# Patient Record
Sex: Male | Born: 1965 | Hispanic: Yes | Marital: Married | Smoking: Never smoker
Health system: Southern US, Community
[De-identification: ages and names within clinical notes are randomized; demographics above are authoritative.]

## PROBLEM LIST (undated history)

## (undated) DIAGNOSIS — Z531 Procedure and treatment not carried out because of patient's decision for reasons of belief and group pressure: Secondary | ICD-10-CM

## (undated) DIAGNOSIS — R42 Dizziness and giddiness: Secondary | ICD-10-CM

## (undated) DIAGNOSIS — IMO0001 Reserved for inherently not codable concepts without codable children: Secondary | ICD-10-CM

## (undated) DIAGNOSIS — D509 Iron deficiency anemia, unspecified: Secondary | ICD-10-CM

## (undated) DIAGNOSIS — R011 Cardiac murmur, unspecified: Secondary | ICD-10-CM

## (undated) DIAGNOSIS — K644 Residual hemorrhoidal skin tags: Secondary | ICD-10-CM

## (undated) DIAGNOSIS — K642 Third degree hemorrhoids: Secondary | ICD-10-CM

## (undated) HISTORY — PX: ADENOIDECTOMY: SUR15

## (undated) HISTORY — PX: COLONOSCOPY: SHX174

## (undated) HISTORY — DX: Iron deficiency anemia, unspecified: D50.9

## (undated) HISTORY — DX: Third degree hemorrhoids: K64.2

## (undated) HISTORY — PX: TONSILLECTOMY: SUR1361

## (undated) HISTORY — DX: Dizziness and giddiness: R42

## (undated) HISTORY — DX: Cardiac murmur, unspecified: R01.1

## (undated) HISTORY — DX: Residual hemorrhoidal skin tags: K64.4

## (undated) HISTORY — PX: OTHER SURGICAL HISTORY: SHX169

---

## 2014-05-31 ENCOUNTER — Encounter: Payer: Self-pay | Admitting: *Deleted

## 2014-06-12 ENCOUNTER — Encounter: Payer: Self-pay | Admitting: Cardiovascular Disease

## 2014-06-12 ENCOUNTER — Ambulatory Visit (INDEPENDENT_AMBULATORY_CARE_PROVIDER_SITE_OTHER): Payer: BC Managed Care – PPO | Admitting: Cardiovascular Disease

## 2014-06-12 ENCOUNTER — Encounter (INDEPENDENT_AMBULATORY_CARE_PROVIDER_SITE_OTHER): Payer: Self-pay

## 2014-06-12 VITALS — BP 132/90 | HR 61 | Ht 70.0 in | Wt 178.0 lb

## 2014-06-12 DIAGNOSIS — R011 Cardiac murmur, unspecified: Secondary | ICD-10-CM | POA: Insufficient documentation

## 2014-06-12 DIAGNOSIS — K644 Residual hemorrhoidal skin tags: Secondary | ICD-10-CM | POA: Insufficient documentation

## 2014-06-12 DIAGNOSIS — D509 Iron deficiency anemia, unspecified: Secondary | ICD-10-CM | POA: Insufficient documentation

## 2014-06-12 DIAGNOSIS — R42 Dizziness and giddiness: Secondary | ICD-10-CM | POA: Insufficient documentation

## 2014-06-12 MED ORDER — FERROUS FUMARATE 325 (106 FE) MG PO TABS: 1.0000 | ORAL_TABLET | Freq: Two times a day (BID) | ORAL | Status: DC

## 2014-06-12 NOTE — Patient Instructions (Addendum)
Please increase your iron pill to twice a day  Your physician has requested that you have an echocardiogram. Echocardiography is a painless test that uses sound waves to create images of your heart. It provides your doctor with information about the size and shape of your heart and how well your heart's chambers and valves are working. This procedure takes approximately one hour. There are no restrictions for this procedure.  Follow up as needed.

## 2014-06-12 NOTE — Progress Notes (Signed)
[address] [place], [place] [address]  Date:  06/12/2014   Patient ID:  Garrett KochJose Barajas, DOB 1966-08-24, MRN [id number]   PCP:  Nadea Kirkland NOT IN SYSTEM  Cardiologist:  Dr. Kirke CorinArida      Patient Profile: 48 y.o. male with history of gradual, intermittent rectal bleeding, external hemorrhoids, and heart murmur.  History of Present Illness: Garrett Barajas is a 48 y.o. male with the above history presents for cardiology referral by Dr. Sherryll BurgerShah, MD for dizziness and heart murmur.   Patient has been feeling dizzy and fatigued for the past month. He would get tired just walking out to the dumpster taking out his trash. He noted a dizziness sensation and states he felt off balance. He does not state the room was spinning. He does not describe a specific event that occurred just prior to the above. He denies any chest pain, chest tightness, chest pressure, SOB, palpitations, presyncope, syncope, edema, early satiety, weight gain, cough, tinnitus, or recent URI. He states many years ago back in [place] he was told he had a heart murmur but no formal work up was done. He denies any history of rheumatic fever.   He does have a history of hemorrhoids. They have present present for several years. PCP note indicates 4 years. Patient states he had a colonoscopy in 2011 for BRBPR that was self reportedly normal except for hemorrhoids.   Patient notes over the past 1 month he has had gradual on and off BRBPR. He had an appointment to see Dr. Marguerite OleaMoffett on 06/06/14, but he canceled it until he could be seen here. He does plan to reschedule this appointment.    Recent Labs: No results found for requested labs within last 365 days.  Wt Readings from Last 3 Encounters:  06/12/14 178 lb (80.74 kg)     Past Medical History  Diagnosis Date  . External hemorrhoids without mention of complication   . Undiagnosed cardiac murmurs   . Dizziness   . Iron deficiency anemia     a. Hgb 6.0 on 05/18/14-  started on ferrous fumarate, patient is a Jehovah's Witness    Current Outpatient Prescriptions  Medication Sig Dispense Refill  . ferrous fumarate (HEMOCYTE - 106 MG FE) 325 (106 FE) MG TABS tablet Take 1 tablet (106 mg of iron total) by mouth 2 (two) times daily.  60 each  4  . MULTIPLE VITAMINS PO Take by mouth daily.       No current facility-administered medications for this visit.    Allergies:   Review of patient's allergies indicates no known allergies.   Social History:  The patient  reports that he has never smoked. He does not have any smokeless tobacco history on file. He reports that he drinks alcohol. He reports that he does not use illicit drugs.   Family History:  The patient's family history includes Diabetes in his father, maternal grandmother, and mother; Hypertension in his father and mother.   ROS:  Please see the the history of present illness. All other systems reviewed and negative.   PHYSICAL EXAM:  VS:  BP 132/90  Pulse 61  Ht 5\' 10"  (1.778 m)  Wt 178 lb (80.74 kg)  BMI 25.54 kg/m2 Well nourished, well developed, in no acute distress HEENT: normal Neck: no JVD Cardiac:  normal S1, S2; RRR. 2/6 flow murmur Lungs:  clear to auscultation bilaterally, no wheezing, rhonchi or rales Abd: soft, nontender, no hepatomegaly Ext: no edema Skin: warm and  dry Neuro:  moves all extremities spontaneously, no focal abnormalities noted  EKG:  NSR, 61, no st/t wave changes  ASSESSMENT AND PLAN:  1. Anemia: Patient was found to have Hgb of 6, MCV 72, iron 11, iron sat 2, TIBC 515, transferrin 459 on 05/18/14. He has had a long history of on off gradual rectal bleeding 2/2 hemorrhoids. Had a colonoscopy in 2011, which is self reported as normal. 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 He was started on ferrous fumarate 325 mg daily 1 month ago and note improvement. He had a GI appointment on 06/06/14 but canceled this until he could be seen here.  He does plan to follow up with GI. We have increased his ferrous fumarate 325 mg to bid. Take with Miralax to prevent constipation in the setting of known hemorrhoids.   2. Dizziness: This is improved since starting ferous fumarate. Patient states he feels like a "new man." Orthostatics ok. CMP ok. See. #1. Recommend TSH if this has not been checked recently.   3. Heart murmur: This is improved from PCP note on 1/6/106/4/15, thus certainly could be a flow murmur related to his anemia. No history of rheumatic fever. Will evaluate with echo. Follow up prn.    I have seen and examined the patient. I agree with the above note which was modified by me as needed.   Lorine BearsMuhammad Arida MD, [facility] 06/12/2014 3:59 PM

## 2014-06-23 ENCOUNTER — Other Ambulatory Visit: Payer: Self-pay

## 2014-06-23 ENCOUNTER — Other Ambulatory Visit (INDEPENDENT_AMBULATORY_CARE_PROVIDER_SITE_OTHER): Payer: BC Managed Care – PPO

## 2014-06-23 DIAGNOSIS — R011 Cardiac murmur, unspecified: Secondary | ICD-10-CM

## 2014-06-23 DIAGNOSIS — R42 Dizziness and giddiness: Secondary | ICD-10-CM

## 2015-12-03 ENCOUNTER — Ambulatory Visit (INDEPENDENT_AMBULATORY_CARE_PROVIDER_SITE_OTHER): Payer: BLUE CROSS/BLUE SHIELD | Admitting: General Surgery

## 2015-12-03 ENCOUNTER — Encounter: Payer: Self-pay | Admitting: General Surgery

## 2015-12-03 VITALS — BP 147/95 | HR 65 | Temp 98.0°F | Ht 70.0 in | Wt 180.0 lb

## 2015-12-03 DIAGNOSIS — K642 Third degree hemorrhoids: Secondary | ICD-10-CM

## 2015-12-03 DIAGNOSIS — K648 Other hemorrhoids: Secondary | ICD-10-CM

## 2015-12-03 MED ORDER — HYDROCORTISONE ACETATE 25 MG RE SUPP: 25.0000 mg | Freq: Two times a day (BID) | RECTAL | Status: DC

## 2015-12-03 NOTE — Progress Notes (Signed)
Outpatient Surgical Follow Up  12/03/2015  Garrett Barajas is an 49 y.o. male.   Chief Complaint  Patient presents with  . Hemorrhoids    HPI: 49 year old male returns to clinic for further evaluation of bleeding internal hemorrhoids. Patient states that over the last 2 years that about once a month he has a popping sensation followed by bright red blood per rectum and hemorrhoidal tissue falling out of his rectum that he has to mainly reduced. Every time this happens he will bleed for approximately 2 weeks. The bleeding can make it into the toilet bowl and then gradually is just on the toilet paper until it stops. He is only been using Preparation H for this. He has not been taking any fiber supplementation and has never been prescribed any steroids for the inflammation. Patient is otherwise in his usual state of excellent health. He denies any fevers, chills, nausea, vomiting, diarrhea, chest pain, shortness of breath. He does state he has intermittent hard stools.  Past Medical History  Diagnosis Date  . External hemorrhoids without mention of complication   . Undiagnosed cardiac murmurs   . Dizziness   . Iron deficiency anemia     a. Hgb 6.0 on 05/18/14- started on ferrous fumarate, patient is a Jehovah's Witness    Past Surgical History  Procedure Laterality Date  . Colonoscopy    . Adenoidectomy    . Tonsillectomy    . Eye growth removal      Family History  Problem Relation Age of Onset  . Diabetes Mother   . Hypertension Mother   . Diabetes Father   . Hypertension Father   . Diabetes Maternal Grandmother     Social History:  reports that he has never smoked. He has never used smokeless tobacco. He reports that he drinks about 0.6 oz of alcohol per week. He reports that he does not use illicit drugs.  Allergies: No Known Allergies  Medications reviewed.    ROS Multisystem review of systems was completed. All pertinent positives negatives are within the history of  present illness.   BP 147/95 mmHg  Pulse 65  Temp(Src) 98 F (36.7 C) (Oral)  Ht 5\' 10"  (1.778 m)  Wt 81.647 kg (180 lb)  BMI 25.83 kg/m2  Physical Exam  Gen.: No acute distress Chest: Clear to all sedation Heart: Regular in rhythm Abdomen: Soft, nontender, nondistended Rectum: Easily visible and palpable grade 3 internal hemorrhoids. The left lateral and right posterior hemorrhoid columns are inflamed but not actively bleeding the right anterior column appears to be smaller than the other 2.   No results found for this or any previous visit (from the past 48 hour(s)). No results found.  Assessment/Plan:  1. Prolapsed internal hemorrhoids, grade 323 49 year old male with grade 3 internal hemorrhoids. Intermittent prolapse requiring manual reduction. Discussed that the underlying pathology of hemorrhoidal disease is hemorrhoidal irritation secondary to hard bowel movements. Counseled as to the appropriateness of using a fiber supplement and adequate water intake. Discussed that for acutely inflamed hemorrhoids that a rectal steroid can assist in the resolution of the inflammation and assist in shrinking of the tissue. Patient has never tried any of these therapies and desires to try these prior to undergoing surgical intervention. We'll prescribe Anusol and patient will add Metamucil and 6 cups of water per day. Follow-up in 6 weeks.     Ricarda Frameharles Tsuyako Jolley, MD FACS General Surgeon  12/03/2015,10:24 AM

## 2015-12-03 NOTE — Patient Instructions (Signed)
We will need you start taking over-the-counter fiber supplements daily. For example: Metamucil, Miralax. We also need for you to drink lots of water (64 oz) per day. You will be put on steroids as well to help.  If you decide to do surgery, remember that it could take 2-3 weeks to feel better. When you have a bowel movement you will probably feel like razor blades.

## 2016-01-10 ENCOUNTER — Encounter (INDEPENDENT_AMBULATORY_CARE_PROVIDER_SITE_OTHER): Payer: Self-pay

## 2016-01-10 ENCOUNTER — Encounter: Payer: Self-pay | Admitting: General Surgery

## 2016-01-10 ENCOUNTER — Ambulatory Visit (INDEPENDENT_AMBULATORY_CARE_PROVIDER_SITE_OTHER): Payer: BLUE CROSS/BLUE SHIELD | Admitting: General Surgery

## 2016-01-10 VITALS — BP 136/82 | HR 66 | Temp 98.5°F | Wt 184.0 lb

## 2016-01-10 DIAGNOSIS — K642 Third degree hemorrhoids: Secondary | ICD-10-CM

## 2016-01-10 DIAGNOSIS — K648 Other hemorrhoids: Secondary | ICD-10-CM | POA: Diagnosis not present

## 2016-01-10 NOTE — Progress Notes (Signed)
Outpatient Surgical Follow Up  01/10/2016  Garrett Barajas is an 50 y.o. male.   Chief Complaint  Patient presents with  . Follow-up    Hemorrhoids    HPI:  50 year old male returns to clinic for follow-up of his hemorrhoids. He states since using the steroid suppositories he has noted some decrease in the size and severity of his hemorrhoid disease. Continues to state that he has prolapsing hemorrhoids that he has to reduce himself after each bowel movement with occasional bleeding. He does notice that  The times in which his hemorrhoids are the worst her after working a full day. He denies any fevers, chills, chest pain, shortness of breath, nausea, vomiting. He's been having regular bowel movements and has started increasing his fiber and water intake. He has noted some bright red blood on the the toilet paper , however this has diminished over the last several days.   Past Medical History  Diagnosis Date  . External hemorrhoids without mention of complication   . Undiagnosed cardiac murmurs   . Dizziness   . Iron deficiency anemia     a. Hgb 6.0 on 05/18/14- started on ferrous fumarate, patient is a Jehovah's Witness    Past Surgical History  Procedure Laterality Date  . Colonoscopy    . Adenoidectomy    . Tonsillectomy    . Eye growth removal      Family History  Problem Relation Age of Onset  . Diabetes Mother   . Hypertension Mother   . Diabetes Father   . Hypertension Father   . Diabetes Maternal Grandmother     Social History:  reports that he has never smoked. He has never used smokeless tobacco. He reports that he drinks about 0.6 oz of alcohol per week. He reports that he does not use illicit drugs.  Allergies: No Known Allergies  Medications reviewed.    ROS  A multipoint review of systems was completed, all pertinent positives and negatives are documented in the history of present illness the remainder negative.   BP 136/82 mmHg  Pulse 66  Temp(Src) 98.5  F (36.9 C) (Oral)  Wt 83.462 kg (184 lb)  Physical Exam   Gen.: No acute distress  chest: Clear to auscultation Heart: Regular rhythm Abdomen: Soft, nontender, nondistended Rectal: visible 2 column grade 3 internal hemorrhoids. No evidence of active bleeding. No evidence of anal fissure.   No results found for this or any previous visit (from the past 48 hour(s)). No results found.  Assessment/Plan:  1. Prolapsed internal hemorrhoids, grade 3  patient with persistent grade 3 internal hemorrhoids. He does state it appears be getting better with the suppositories. He desires to give him another month to provide relief before deciding to undergo surgery. He'll follow up with clinic in 4 weeks to document whether or not there is clinical improvement or need for surgical intervention.     Ricarda Frame, MD FACS General Surgeon  01/10/2016,10:13 AM

## 2016-01-10 NOTE — Patient Instructions (Signed)
Continue to use the suppositories. Try for a month and then we will see you and then you will let us know what you would want to do next.  Hydrocortisone suppositories-OTC

## 2016-01-11 ENCOUNTER — Telehealth: Payer: Self-pay

## 2016-01-11 NOTE — Telephone Encounter (Signed)
Called patient's wife back and asked how I was able to help her husband get his prescription filled. Mrs. Alric Seton Snelling stated that she had taken two forms from [employer] to our [place] office for [place] to fill out and fax back. I told her that I saw it on his chart and that I would fill them out and fax. Mrs. Derousse understood.

## 2016-01-11 NOTE — Telephone Encounter (Signed)
-----   Message from Harvel Quale, RN sent at 01/11/2016  2:10 PM EST ----- Regarding: FW: info to chart   ----- Message -----    From: [place]ett [place]    Sent: 01/11/2016  11:01 AM      To: Harvel Quale, RN Subject: info to chart                                  I put info to chart regarding the medication Hydrocortisone suppositories OTC. Pharmacy said this medication is not on the list. Please call wife Alric Seton) [phone]. She ask that you could do this today. Needs to say the doctor said it was urgent

## 2016-01-11 NOTE — Telephone Encounter (Signed)
Faxed two sheets from [employer] that were provided by the patient's wife Alric Seton. Hopefully this will be sufficient for patient's insurance company to authorize his Anusol. Awaiting for authorization from [facility].  Called Mrs. Dec to let her know that I faxed the forms and now we are waiting for their response.

## 2016-02-10 DIAGNOSIS — R42 Dizziness and giddiness: Secondary | ICD-10-CM | POA: Insufficient documentation

## 2016-02-10 DIAGNOSIS — R011 Cardiac murmur, unspecified: Secondary | ICD-10-CM | POA: Insufficient documentation

## 2016-02-12 ENCOUNTER — Encounter: Payer: Self-pay | Admitting: General Surgery

## 2016-02-12 ENCOUNTER — Ambulatory Visit (INDEPENDENT_AMBULATORY_CARE_PROVIDER_SITE_OTHER): Payer: BLUE CROSS/BLUE SHIELD | Admitting: General Surgery

## 2016-02-12 VITALS — BP 139/81 | HR 74 | Temp 97.5°F | Wt 182.0 lb

## 2016-02-12 DIAGNOSIS — K648 Other hemorrhoids: Secondary | ICD-10-CM

## 2016-02-12 DIAGNOSIS — K642 Third degree hemorrhoids: Secondary | ICD-10-CM

## 2016-02-12 NOTE — Progress Notes (Signed)
Outpatient Surgical Follow Up  02/12/2016  Garrett Barajas is an 50 y.o. male.   Chief Complaint  Patient presents with  . Follow-up    External Hemorrhoids    HPI: 50 year old male returns to clinic for continued evaluation of hemorrhoids. Patient states that they have not changed since his last visit. They continue to be a daily new system. He does also have to reduce a single hemorrhoid after every bowel movement. It has not bled last 2 weeks. He denies any fevers, chills, nausea, vomiting, chest pain, shortness of breath, diarrhea, constipation. He is having regular bowel movements. He is interested in surgical intervention at this time as he can no longer handle the daily dehiscence.  Past Medical History  Diagnosis Date  . External hemorrhoids without mention of complication   . Undiagnosed cardiac murmurs   . Dizziness   . Iron deficiency anemia     a. Hgb 6.0 on 05/18/14- started on ferrous fumarate, patient is a Jehovah's Witness    Past Surgical History  Procedure Laterality Date  . Colonoscopy    . Adenoidectomy    . Tonsillectomy    . Eye growth removal      Family History  Problem Relation Age of Onset  . Diabetes Mother   . Hypertension Mother   . Diabetes Father   . Hypertension Father   . Diabetes Maternal Grandmother     Social History:  reports that he has never smoked. He has never used smokeless tobacco. He reports that he drinks about 0.6 oz of alcohol per week. He reports that he does not use illicit drugs.  Allergies: No Known Allergies  Medications reviewed.    ROS A multipoint review of systems was completed, all pertinent positives and negatives were documented within the history of present illness and the remainder negative.   BP 139/81 mmHg  Pulse 74  Temp(Src) 97.5 F (36.4 C) (Oral)  Wt 82.555 kg (182 lb)  Physical Exam  Gen.: No acute distress  Neck: Supple and nontender Nodes: No evidence of cervical, clavicular  lymphadenopathy chest: Clear to auscultation Heart: Regular rate and rhythm Abdomen: Soft, nontender, nondistended Rectum: Obvious visible grade 3 internal hemorrhoid in the right anterior column. Normal tone without evidence of bleeding or fissure.   No results found for this or any previous visit (from the past 48 hour(s)). No results found.  Assessment/Plan:  1. Prolapsed internal hemorrhoids, grade 65 50 year old male with persistent grade 3 internal hemorrhoids. The procedure of an open hemorrhoidectomy was discussed with him and detail. The risks, benefits alternatives were discussed with the primary risk being pain, bleeding, infection, need for additional surgeries, damage to the sphincter, possible stricture of the rectum. Patient voiced understanding and still wishes to proceed. 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 Discussed that this should not be a very bloody surgery and should not be an issue. Plan for surgical intervention on 03/07/2016.     Ricarda Frame, MD [facility] General Surgeon  02/12/2016,9:52 AM

## 2016-02-12 NOTE — Patient Instructions (Signed)
Just so you know, this surgery will be very painful. When you have a bowel movement you will feel a lot of pain. However, the doctor will give you pain medications to help with the pain.

## 2016-02-15 NOTE — Pre-Procedure Instructions (Signed)
Angie called to request a change in the Pre Admit appt. To early next week due to moving surgery to 3/14.  Will contact patient with new time.

## 2016-02-21 ENCOUNTER — Telehealth: Payer: Self-pay | Admitting: General Surgery

## 2016-02-21 ENCOUNTER — Telehealth: Payer: Self-pay

## 2016-02-21 ENCOUNTER — Encounter
Admission: RE | Admit: 2016-02-21 | Discharge: 2016-02-21 | Disposition: A | Discharge status: Home / Self Care | Payer: BLUE CROSS/BLUE SHIELD | Source: Ambulatory Visit | Attending: General Surgery | Admitting: General Surgery

## 2016-02-21 ENCOUNTER — Ambulatory Visit
Admission: RE | Admit: 2016-02-21 | Discharge: 2016-02-21 | Disposition: A | Discharge status: Home / Self Care | Payer: BLUE CROSS/BLUE SHIELD | Source: Ambulatory Visit | Attending: General Surgery | Admitting: General Surgery

## 2016-02-21 DIAGNOSIS — Z01818 Encounter for other preprocedural examination: Secondary | ICD-10-CM | POA: Diagnosis not present

## 2016-02-21 DIAGNOSIS — D649 Anemia, unspecified: Secondary | ICD-10-CM

## 2016-02-21 DIAGNOSIS — Z01810 Encounter for preprocedural cardiovascular examination: Secondary | ICD-10-CM

## 2016-02-21 LAB — BASIC METABOLIC PANEL
Anion gap: 8 (ref 5–15)
BUN: 15 mg/dL (ref 6–20)
CHLORIDE: 105 mmol/L (ref 101–111)
CO2: 26 mmol/L (ref 22–32)
CREATININE: 0.81 mg/dL (ref 0.61–1.24)
Calcium: 8.9 mg/dL (ref 8.9–10.3)
Glucose, Bld: 90 mg/dL (ref 65–99)
Potassium: 3.9 mmol/L (ref 3.5–5.1)
SODIUM: 139 mmol/L (ref 135–145)

## 2016-02-21 LAB — CBC WITH DIFFERENTIAL/PLATELET
BASOS ABS: 0.1 10*3/uL (ref 0–0.1)
BASOS PCT: 2 %
EOS ABS: 0.5 10*3/uL (ref 0–0.7)
EOS PCT: 13 %
HCT: 26.1 % — ABNORMAL LOW (ref 40.0–52.0)
HEMOGLOBIN: 8.1 g/dL — AB (ref 13.0–18.0)
LYMPHS ABS: 1.3 10*3/uL (ref 1.0–3.6)
Lymphocytes Relative: 35 %
MCH: 21.5 pg — ABNORMAL LOW (ref 26.0–34.0)
MCHC: 31.2 g/dL — ABNORMAL LOW (ref 32.0–36.0)
MCV: 69 fL — ABNORMAL LOW (ref 80.0–100.0)
Monocytes Absolute: 0.4 10*3/uL (ref 0.2–1.0)
Monocytes Relative: 11 %
NEUTROS PCT: 39 %
Neutro Abs: 1.5 10*3/uL (ref 1.4–6.5)
PLATELETS: 521 10*3/uL — AB (ref 150–440)
RBC: 3.77 MIL/uL — AB (ref 4.40–5.90)
RDW: 18.5 % — ABNORMAL HIGH (ref 11.5–14.5)
WBC: 3.8 10*3/uL (ref 3.8–10.6)

## 2016-02-21 LAB — PROTIME-INR
INR: 1.21
PROTHROMBIN TIME: 15.5 s — AB (ref 11.4–15.0)

## 2016-02-21 LAB — APTT: APTT: 34 s (ref 24–36)

## 2016-02-21 NOTE — Pre-Procedure Instructions (Signed)
Dr Maisie Fushomas reviewed Hgb/Hct and is requesting medical consult. Angie at office notified, labs EKG and CXR faxed. Patient has no PCP listed.

## 2016-02-21 NOTE — Patient Instructions (Signed)
  Your procedure is scheduled on: Monday 02/25/16 Report to Day Surgery. 2ND FLOOR MEDICAL MALL ENTRANCE To find out your arrival time please call [phone] between 1PM - 3PM on Friday 02/22/16.  Remember: Instructions that are not followed completely may result in serious medical risk, up to and including death, or upon the discretion of your surgeon and anesthesiologist your surgery may need to be rescheduled.    __X__ 1. Do not eat food or drink liquids after midnight. No gum chewing or hard candies.     __X__ 2. No Alcohol for 24 hours before or after surgery.   ____ 3. Bring all medications with you on the day of surgery if instructed.    __X__ 4. Notify your doctor if there is any change in your medical condition     (cold, fever, infections).     Do not wear jewelry, make-up, hairpins, clips or nail polish.  Do not wear lotions, powders, or perfumes. You may wear deodorant.  Do not shave 48 hours prior to surgery. Men may shave face and neck.  Do not bring valuables to the hospital.    [facility] for any belongings or valuables.               Contacts, dentures or bridgework may not be worn into surgery.  Leave your suitcase in the car. After surgery it may be brought to your room.  For patients admitted to the hospital, discharge time is determined by your                treatment team.   Patients discharged the day of surgery will not be allowed to drive home.   Please read over the following fact sheets that you were given:   Surgical Site Infection Prevention   ____ Take these medicines the morning of surgery with A SIP OF WATER:    1. NONE  2.   3.   4.  5.  6.  __X__ Fleet Enema (as directed)   ____ Use CHG Soap as directed  ____ Use inhalers on the day of surgery  ____ Stop metformin 2 days prior to surgery    ____ Take 1/2 of usual insulin dose the night before surgery and none on the morning of surgery.   ____ Stop  Coumadin/Plavix/aspirin on   ____ Stop Anti-inflammatories on    ____ Stop supplements until after surgery.    ____ Bring C-Pap to the hospital.

## 2016-02-21 NOTE — Telephone Encounter (Signed)
Pt advised of pre op date/time and sx date. Sx: 02/25/16 with Dr Rayford HalstedWoodham--Hemorrhoidectomy.  Pre op: 02/21/16 @ 7:30 am--Office.   Patient made aware to call [phone], between 1-3:00 pm the day before surgery, to find out what time to arrive.

## 2016-02-21 NOTE — Telephone Encounter (Signed)
Received a call from pre-admit regarding this patient as his Hemoglobin is currently 8.1. 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 Does not have a PCP.  Patient is scheduled for Hemorrhoidectomy (02/25/16) with Dr. Tonita CongWoodham.  Will speak with Dr. Tonita CongWoodham regarding this patient.

## 2016-02-22 ENCOUNTER — Other Ambulatory Visit
Admission: RE | Admit: 2016-02-22 | Discharge: 2016-02-22 | Disposition: A | Discharge status: Home / Self Care | Payer: BLUE CROSS/BLUE SHIELD | Source: Ambulatory Visit | Attending: General Surgery | Admitting: General Surgery

## 2016-02-22 DIAGNOSIS — D649 Anemia, unspecified: Secondary | ICD-10-CM | POA: Diagnosis present

## 2016-02-22 LAB — HEMOGLOBIN AND HEMATOCRIT, BLOOD
HCT: 27.2 % — ABNORMAL LOW (ref 40.0–52.0)
HEMOGLOBIN: 8.2 g/dL — AB (ref 13.0–18.0)

## 2016-02-22 NOTE — Telephone Encounter (Signed)
Spoke with Dr. Tonita CongWoodham regarding this patient. He has advised that we will recheck Hemoglobin today and if still anemic will need to see hematology and surgery will need to be delayed. Will also find out if patient is bleeding as this may be of concern. Patient does have a history of Anemia.

## 2016-02-22 NOTE — Telephone Encounter (Signed)
Patient's recheck Hemoglobin is still only 8.2.   Patient has been made aware that surgery will be cancelled and he should be expecting a call from Hematology to get this anemia under control. As soon as his Hemoglobin is up to a normal level, we can see patient back and place him on OR schedule once again for surgery.  Please cancel 02/25/16 scheduled Hemorrhoidectomy with Dr. Tonita CongWoodham and place a referral to hematology for Anemia.

## 2016-02-22 NOTE — Telephone Encounter (Signed)
Called patient explained that I would need to have him go to the Medical [facility] to have his blood rechecked and if he is still anemic, will cancel surgery until blood levels are higher. He verbalizes understanding of this and is on his way to medical mall at this time.

## 2016-02-25 ENCOUNTER — Ambulatory Visit
Admission: RE | Admit: 2016-02-25 | Payer: BLUE CROSS/BLUE SHIELD | Source: Ambulatory Visit | Admitting: General Surgery

## 2016-02-25 ENCOUNTER — Encounter: Admission: RE | Payer: Self-pay | Source: Ambulatory Visit

## 2016-02-25 SURGERY — HEMORRHOIDECTOMY
Anesthesia: Choice

## 2016-02-25 NOTE — Telephone Encounter (Signed)
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 His Hemoglobin is currently 8.. He was scheduled for a Hemorrhoidectomy with Dr Tonita CongWoodham, however it was canceled due to his hemoglobin.  Patient has been referred to the cancer center for treatment.

## 2016-02-26 ENCOUNTER — Other Ambulatory Visit: Payer: BLUE CROSS/BLUE SHIELD

## 2016-02-26 NOTE — Telephone Encounter (Signed)
[facility] to make appointment for Anemia. Message left. Will await call back for scheduling.

## 2016-02-28 NOTE — Telephone Encounter (Signed)
Appointment has been made for 02/29/16 with Dr. Orlie DakinFinnegan. Cancer Center to call patient with appointment details.

## 2016-02-29 ENCOUNTER — Inpatient Hospital Stay: Payer: BLUE CROSS/BLUE SHIELD | Attending: Oncology | Admitting: Oncology

## 2016-02-29 ENCOUNTER — Inpatient Hospital Stay: Payer: BLUE CROSS/BLUE SHIELD

## 2016-02-29 VITALS — BP 143/85 | HR 71 | Temp 96.8°F | Resp 20 | Wt 182.5 lb

## 2016-02-29 DIAGNOSIS — K921 Melena: Secondary | ICD-10-CM | POA: Diagnosis not present

## 2016-02-29 DIAGNOSIS — R5381 Other malaise: Secondary | ICD-10-CM | POA: Insufficient documentation

## 2016-02-29 DIAGNOSIS — K625 Hemorrhage of anus and rectum: Secondary | ICD-10-CM | POA: Insufficient documentation

## 2016-02-29 DIAGNOSIS — K649 Unspecified hemorrhoids: Secondary | ICD-10-CM | POA: Diagnosis not present

## 2016-02-29 DIAGNOSIS — Z79899 Other long term (current) drug therapy: Secondary | ICD-10-CM | POA: Insufficient documentation

## 2016-02-29 DIAGNOSIS — D500 Iron deficiency anemia secondary to blood loss (chronic): Secondary | ICD-10-CM | POA: Insufficient documentation

## 2016-02-29 DIAGNOSIS — D509 Iron deficiency anemia, unspecified: Secondary | ICD-10-CM

## 2016-02-29 DIAGNOSIS — R5383 Other fatigue: Secondary | ICD-10-CM | POA: Diagnosis not present

## 2016-02-29 LAB — CBC
HEMATOCRIT: 29.2 % — AB (ref 40.0–52.0)
Hemoglobin: 8.8 g/dL — ABNORMAL LOW (ref 13.0–18.0)
MCH: 20.6 pg — ABNORMAL LOW (ref 26.0–34.0)
MCHC: 30.1 g/dL — ABNORMAL LOW (ref 32.0–36.0)
MCV: 68.3 fL — AB (ref 80.0–100.0)
Platelets: 634 10*3/uL — ABNORMAL HIGH (ref 150–440)
RBC: 4.28 MIL/uL — ABNORMAL LOW (ref 4.40–5.90)
RDW: 18.9 % — AB (ref 11.5–14.5)
WBC: 5.4 10*3/uL (ref 3.8–10.6)

## 2016-02-29 LAB — DAT, POLYSPECIFIC AHG (ARMC ONLY): POLYSPECIFIC AHG TEST: NEGATIVE

## 2016-02-29 LAB — IRON AND TIBC
Iron: 92 ug/dL (ref 45–182)
SATURATION RATIOS: 14 % — AB (ref 17.9–39.5)
TIBC: 643 ug/dL — AB (ref 250–450)
UIBC: 551 ug/dL

## 2016-02-29 LAB — FOLATE: Folate: 19.6 ng/mL (ref >5.9)

## 2016-02-29 LAB — FERRITIN: FERRITIN: 4 ng/mL — AB (ref 24–336)

## 2016-02-29 LAB — LACTATE DEHYDROGENASE: LDH: 134 U/L (ref 98–192)

## 2016-02-29 NOTE — Progress Notes (Signed)
Patient here today for initial evaluation regarding anemia. Patient denies any concerns today.  

## 2016-03-01 LAB — VITAMIN B12: VITAMIN B 12: 430 pg/mL (ref 180–914)

## 2016-03-02 LAB — RETICULOCYTES
RBC.: 4.27 MIL/uL — ABNORMAL LOW (ref 4.40–5.90)
RETIC COUNT ABSOLUTE: 111 10*3/uL (ref 19.0–183.0)
Retic Ct Pct: 2.6 % (ref 0.4–3.1)

## 2016-03-04 LAB — HAPTOGLOBIN: Haptoglobin: 49 mg/dL (ref 34–200)

## 2016-03-04 LAB — HEMOGLOBINOPATHY EVALUATION
HGB A: 98.4 % — AB (ref 94.0–98.0)
Hgb A2 Quant: 1.6 % (ref 0.7–3.1)
Hgb C: 0 %
Hgb F Quant: 0 % (ref 0.0–2.0)
Hgb S Quant: 0 %

## 2016-03-12 ENCOUNTER — Other Ambulatory Visit: Payer: Self-pay | Admitting: Oncology

## 2016-03-12 DIAGNOSIS — D509 Iron deficiency anemia, unspecified: Secondary | ICD-10-CM

## 2016-03-13 ENCOUNTER — Inpatient Hospital Stay (HOSPITAL_BASED_OUTPATIENT_CLINIC_OR_DEPARTMENT_OTHER): Payer: BLUE CROSS/BLUE SHIELD | Admitting: Oncology

## 2016-03-13 ENCOUNTER — Inpatient Hospital Stay: Payer: BLUE CROSS/BLUE SHIELD

## 2016-03-13 VITALS — BP 127/81 | HR 73 | Temp 97.7°F | Wt 182.5 lb

## 2016-03-13 DIAGNOSIS — D649 Anemia, unspecified: Secondary | ICD-10-CM

## 2016-03-13 DIAGNOSIS — D500 Iron deficiency anemia secondary to blood loss (chronic): Secondary | ICD-10-CM

## 2016-03-13 DIAGNOSIS — K921 Melena: Secondary | ICD-10-CM | POA: Diagnosis not present

## 2016-03-13 DIAGNOSIS — K625 Hemorrhage of anus and rectum: Secondary | ICD-10-CM

## 2016-03-13 DIAGNOSIS — R5383 Other fatigue: Secondary | ICD-10-CM

## 2016-03-13 DIAGNOSIS — D509 Iron deficiency anemia, unspecified: Secondary | ICD-10-CM

## 2016-03-13 DIAGNOSIS — R5381 Other malaise: Secondary | ICD-10-CM

## 2016-03-13 DIAGNOSIS — Z79899 Other long term (current) drug therapy: Secondary | ICD-10-CM

## 2016-03-13 LAB — CBC WITH DIFFERENTIAL/PLATELET
BASOS ABS: 0.1 10*3/uL (ref 0–0.1)
BASOS PCT: 2 %
EOS ABS: 0.7 10*3/uL (ref 0–0.7)
EOS PCT: 14 %
HCT: 34.4 % — ABNORMAL LOW (ref 40.0–52.0)
Hemoglobin: 10.8 g/dL — ABNORMAL LOW (ref 13.0–18.0)
LYMPHS PCT: 36 %
Lymphs Abs: 1.9 10*3/uL (ref 1.0–3.6)
MCH: 23.1 pg — ABNORMAL LOW (ref 26.0–34.0)
MCHC: 31.3 g/dL — ABNORMAL LOW (ref 32.0–36.0)
MCV: 73.8 fL — ABNORMAL LOW (ref 80.0–100.0)
Monocytes Absolute: 0.3 10*3/uL (ref 0.2–1.0)
Monocytes Relative: 7 %
Neutro Abs: 2.2 10*3/uL (ref 1.4–6.5)
Neutrophils Relative %: 41 %
PLATELETS: 216 10*3/uL (ref 150–440)
RBC: 4.66 MIL/uL (ref 4.40–5.90)
RDW: 30.4 % — AB (ref 11.5–14.5)
WBC: 5.3 10*3/uL (ref 3.8–10.6)

## 2016-03-13 LAB — IRON AND TIBC
IRON: 21 ug/dL — AB (ref 45–182)
SATURATION RATIOS: 4 % — AB (ref 17.9–39.5)
TIBC: 523 ug/dL — AB (ref 250–450)
UIBC: 502 ug/dL

## 2016-03-13 LAB — FERRITIN: FERRITIN: 14 ng/mL — AB (ref 24–336)

## 2016-03-13 NOTE — Progress Notes (Signed)
Patient fatigue is at his baseline.

## 2016-03-14 ENCOUNTER — Other Ambulatory Visit: Payer: BLUE CROSS/BLUE SHIELD

## 2016-03-14 ENCOUNTER — Ambulatory Visit: Payer: BLUE CROSS/BLUE SHIELD | Admitting: Oncology

## 2016-03-14 ENCOUNTER — Ambulatory Visit: Payer: BLUE CROSS/BLUE SHIELD

## 2016-03-16 NOTE — Progress Notes (Signed)
[facility]  Telephone:(336) [phone] Fax:(336) [phone]  ID: Loman ChromanJose Salmela OB: 1966/06/16  MR#: [id number]  KGM#:[id number]#:[id number]  Patient Care Team: No Pcp Per Patient as PCP - General (General Practice)  CHIEF COMPLAINT:  Chief Complaint  Patient presents with  . Anemia    INTERVAL HISTORY: Patient returns to clinic today for further evaluation, discussion of his laboratory work, and initiation of IV Feraheme. He continues to have occasional bright red blood per rectum. He is a Jehovah witness and cannot receive blood products. He admits to her baseline fatigue, but otherwise feels well. He has no neurologic complaints. He denies any recent fevers or illnesses. He has a good appetite and denies weight loss. He has no chest pain or shortness of breath. He denies any nausea, vomiting, constipation, or diarrhea. He has no urinary complaints. Patient offers no further specific complaints.  REVIEW OF SYSTEMS:   Review of Systems  Constitutional: Positive for malaise/fatigue. Negative for fever and weight loss.  Respiratory: Negative.  Negative for shortness of breath.   Cardiovascular: Negative.  Negative for chest pain.  Gastrointestinal: Positive for blood in stool. Negative for abdominal pain and melena.  Genitourinary: Negative.   Musculoskeletal: Negative.   Neurological: Negative.  Negative for weakness.    As per HPI. Otherwise, a complete review of systems is negatve.  PAST MEDICAL HISTORY: Past Medical History  Diagnosis Date  . External hemorrhoids without mention of complication   . Undiagnosed cardiac murmurs   . Dizziness   . Iron deficiency anemia     a. Hgb 6.0 on 05/18/14- started on ferrous fumarate, patient is a Jehovah's Witness    PAST SURGICAL HISTORY: Past Surgical History  Procedure Laterality Date  . Colonoscopy    . Adenoidectomy    . Tonsillectomy    . Eye growth removal      FAMILY HISTORY Family History  Problem Relation Age of  Onset  . Diabetes Mother   . Hypertension Mother   . Diabetes Father   . Hypertension Father   . Diabetes Maternal Grandmother        ADVANCED DIRECTIVES:    HEALTH MAINTENANCE: Social History  Substance Use Topics  . Smoking status: Never Smoker   . Smokeless tobacco: Never Used  . Alcohol Use: 0.6 oz/week    1 Glasses of wine per week     Colonoscopy:  PAP:  Bone density:  Lipid panel:  No Known Allergies  Current Outpatient Prescriptions  Medication Sig Dispense Refill  . ferrous sulfate 325 (65 FE) MG tablet Take 325 mg by mouth daily with breakfast.    . psyllium (METAMUCIL SMOOTH TEXTURE) 28 % packet Take 1 packet by mouth 2 (two) times daily.     No current facility-administered medications for this visit.    OBJECTIVE: Filed Vitals:   03/13/16 0918  BP: 127/81  Pulse: 73  Temp: 97.7 F (36.5 C)     Body mass index is 26.19 kg/(m^2).    ECOG FS:0 - Asymptomatic  General: Well-developed, well-nourished, no acute distress. Eyes: Pink conjunctiva, anicteric sclera. Lungs: Clear to auscultation bilaterally. Heart: Regular rate and rhythm. No rubs, murmurs, or gallops. Abdomen: Soft, nontender, nondistended. No organomegaly noted, normoactive bowel sounds. Musculoskeletal: No edema, cyanosis, or clubbing. Neuro: Alert, answering all questions appropriately. Cranial nerves grossly intact. Skin: No rashes or petechiae noted. Psych: Normal affect.   LAB RESULTS:  Lab Results  Component Value Date   NA 139 02/21/2016   K 3.9 02/21/2016  CL 105 02/21/2016   CO2 26 02/21/2016   GLUCOSE 90 02/21/2016   BUN 15 02/21/2016   CREATININE 0.81 02/21/2016   CALCIUM 8.9 02/21/2016   GFRNONAA >60 02/21/2016   GFRAA >60 02/21/2016    Lab Results  Component Value Date   WBC 5.3 03/13/2016   NEUTROABS 2.2 03/13/2016   HGB 10.8* 03/13/2016   HCT 34.4* 03/13/2016   MCV 73.8* 03/13/2016   PLT 216 03/13/2016   Lab Results  Component Value Date   IRON 21*  03/13/2016   TIBC 523* 03/13/2016   IRONPCTSAT 4* 03/13/2016    Lab Results  Component Value Date   FERRITIN 14* 03/13/2016     STUDIES: Chest 2 View  02/21/2016  CLINICAL DATA:  Preoperative exam prior to hemorrhoids surgery, no cardiopulmonary history other than cardiac murmur, nonsmoker. EXAM: CHEST  2 VIEW COMPARISON:  None in PACs FINDINGS: The lungs are well-expanded and clear. The heart and pulmonary vascularity are normal. The mediastinum is normal in width. There is no pleural effusion. The bony thorax is unremarkable. IMPRESSION: There is no active cardiopulmonary disease. Electronically Signed   By: David  [place] M.D.   On: 02/21/2016 08:50    ASSESSMENT: Iron deficiency anemia secondary to blood loss.  PLAN:    1. Iron deficiency anemia: Patient's states he started taking oral iron supplementation and changed his diet. His hemoglobin is mildly improved, but his iron stores are still significantly decreased. After discussion with the patient, he does not wish to proceed with IV Feraheme today. He states he would rather continue his dietary modifications. No intervention is needed. The remainder of his blood work is either negative or within normal limits. Patient is a Jehovah witness therefore cannot receive blood products, but IV Feraheme is adequate. Return to clinic in 3 months for further evaluation and consideration of Feraheme. Have also recommended patient have a full colonoscopy in the near future and a referral was previously given to GI. 2. Hemorrhoids: Treatment per surgery.  Patient expressed understanding and was in agreement with this plan. He also understands that He can call clinic at any time with any questions, concerns, or complaints.    Jeralyn Ruths, MD   03/16/2016 8:40 AM

## 2016-03-16 NOTE — Progress Notes (Signed)
[facility]  Telephone:(336) [phone] Fax:(336) [phone]  ID: Garrett Barajas OB: 11-30-1966  MR#: [id number]  AOZ#:[id number]  Patient Care Team: No Pcp Per Patient as PCP - General (General Practice)  CHIEF COMPLAINT:  Chief Complaint  Patient presents with  . New Evaluation    anemia    INTERVAL HISTORY: Patient is a 50 year old male who was noted to have worsening anemia on routine blood work. He is scheduled for hemorrhoidectomy in the near future. He continues to have occasional bright red blood per rectum. He is a Jehovah witness and cannot receive blood products. He currently feels well and is at his baseline. He has no neurologic complaints. He denies any recent fevers or illnesses. He has a good appetite and denies weight loss. He has no chest pain or shortness of breath. He denies any nausea, vomiting, constipation, or diarrhea. He has no urinary complaints. Patient otherwise feels well and offers no further specific complaints.  REVIEW OF SYSTEMS:   Review of Systems  Constitutional: Negative.  Negative for fever, weight loss and malaise/fatigue.  Respiratory: Negative.  Negative for shortness of breath.   Cardiovascular: Negative.  Negative for chest pain.  Gastrointestinal: Positive for blood in stool. Negative for abdominal pain and melena.  Genitourinary: Negative.   Musculoskeletal: Negative.   Neurological: Negative.  Negative for weakness.    As per HPI. Otherwise, a complete review of systems is negatve.  PAST MEDICAL HISTORY: Past Medical History  Diagnosis Date  . External hemorrhoids without mention of complication   . Undiagnosed cardiac murmurs   . Dizziness   . Iron deficiency anemia     a. Hgb 6.0 on 05/18/14- started on ferrous fumarate, patient is a Jehovah's Witness    PAST SURGICAL HISTORY: Past Surgical History  Procedure Laterality Date  . Colonoscopy    . Adenoidectomy    . Tonsillectomy    . Eye growth removal       FAMILY HISTORY Family History  Problem Relation Age of Onset  . Diabetes Mother   . Hypertension Mother   . Diabetes Father   . Hypertension Father   . Diabetes Maternal Grandmother        ADVANCED DIRECTIVES:    HEALTH MAINTENANCE: Social History  Substance Use Topics  . Smoking status: Never Smoker   . Smokeless tobacco: Never Used  . Alcohol Use: 0.6 oz/week    1 Glasses of wine per week     Colonoscopy:  PAP:  Bone density:  Lipid panel:  No Known Allergies  Current Outpatient Prescriptions  Medication Sig Dispense Refill  . ferrous sulfate 325 (65 FE) MG tablet Take 325 mg by mouth daily with breakfast.    . psyllium (METAMUCIL SMOOTH TEXTURE) 28 % packet Take 1 packet by mouth 2 (two) times daily.     No current facility-administered medications for this visit.    OBJECTIVE: Filed Vitals:   02/29/16 1144  BP: 143/85  Pulse: 71  Temp: 96.8 F (36 C)  Resp: 20     Body mass index is 26.19 kg/(m^2).    ECOG FS:0 - Asymptomatic  General: Well-developed, well-nourished, no acute distress. Eyes: Pink conjunctiva, anicteric sclera. HEENT: Normocephalic, moist mucous membranes, clear oropharnyx. Lungs: Clear to auscultation bilaterally. Heart: Regular rate and rhythm. No rubs, murmurs, or gallops. Abdomen: Soft, nontender, nondistended. No organomegaly noted, normoactive bowel sounds. Musculoskeletal: No edema, cyanosis, or clubbing. Neuro: Alert, answering all questions appropriately. Cranial nerves grossly intact. Skin: No rashes or petechiae noted.  Psych: Normal affect. Lymphatics: No cervical, calvicular, axillary or inguinal LAD.   LAB RESULTS:  Lab Results  Component Value Date   NA 139 02/21/2016   K 3.9 02/21/2016   CL 105 02/21/2016   CO2 26 02/21/2016   GLUCOSE 90 02/21/2016   BUN 15 02/21/2016   CREATININE 0.81 02/21/2016   CALCIUM 8.9 02/21/2016   GFRNONAA >60 02/21/2016   GFRAA >60 02/21/2016    Lab Results  Component  Value Date   WBC 5.3 03/13/2016   NEUTROABS 2.2 03/13/2016   HGB 10.8* 03/13/2016   HCT 34.4* 03/13/2016   MCV 73.8* 03/13/2016   PLT 216 03/13/2016   Lab Results  Component Value Date   IRON 21* 03/13/2016   TIBC 523* 03/13/2016   IRONPCTSAT 4* 03/13/2016    Lab Results  Component Value Date   FERRITIN 14* 03/13/2016     STUDIES: Chest 2 View  02/21/2016  CLINICAL DATA:  Preoperative exam prior to hemorrhoids surgery, no cardiopulmonary history other than cardiac murmur, nonsmoker. EXAM: CHEST  2 VIEW COMPARISON:  None in PACs FINDINGS: The lungs are well-expanded and clear. The heart and pulmonary vascularity are normal. The mediastinum is normal in width. There is no pleural effusion. The bony thorax is unremarkable. IMPRESSION: There is no active cardiopulmonary disease. Electronically Signed   By: David  [place] M.D.   On: 02/21/2016 08:50    ASSESSMENT: Iron deficiency anemia secondary to blood loss.  PLAN:    1. Iron deficiency anemia: Patient's hemoglobin and iron stores are decreased, likely secondary from his continue blood loss from hemorrhoids. Remainder of his blood work is either negative or within normal limits. Patient is a Jehovah witness therefore cannot receive blood products, but IV Feraheme is adequate. Return to clinic in 2 weeks for further evaluation and consideration of Feraheme. Have also recommended patient have a full colonoscopy in the near future and a referral was given to GI. 2. Hemorrhoids: Treatment per surgery.  Patient expressed understanding and was in agreement with this plan. He also understands that He can call clinic at any time with any questions, concerns, or complaints.    [name], MD   03/16/2016 8:35 AM

## 2016-03-18 ENCOUNTER — Ambulatory Visit: Payer: BLUE CROSS/BLUE SHIELD | Admitting: Gastroenterology

## 2016-04-03 ENCOUNTER — Other Ambulatory Visit: Payer: Self-pay

## 2016-04-03 ENCOUNTER — Encounter: Payer: Self-pay | Admitting: Gastroenterology

## 2016-04-03 ENCOUNTER — Ambulatory Visit (INDEPENDENT_AMBULATORY_CARE_PROVIDER_SITE_OTHER): Payer: BLUE CROSS/BLUE SHIELD | Admitting: Gastroenterology

## 2016-04-03 VITALS — BP 127/76 | HR 68 | Temp 98.8°F | Ht 69.0 in | Wt 184.0 lb

## 2016-04-03 DIAGNOSIS — K921 Melena: Secondary | ICD-10-CM

## 2016-04-03 NOTE — Progress Notes (Signed)
Gastroenterology Consultation  Referring Provider:     No ref. provider found Primary Care Physician:  No PCP Per Patient Primary Gastroenterologist:  Dr. Servando SnareWohl     Reason for Consultation:     Iron deficiency anemia        HPI:   Garrett Barajas is a 50 y.o. y/o male referred for consultation & management of Iron deficiency anemia by Dr. Bonnetta BarryNo PCP Per Patient.  This patient comes today with a history of iron deficiency anemia. The patient has had a significant history of rectal bleeding. The patient was seen by Dr. Virl DiamondWoodam who is to take the patient for hemorrhoidal surgery but wanted the patient to be evaluated for his anemia prior to doing that. The patient states that when he has rectal bleeding it is a large amount of blood and fills up the toilet bowl. The patient states that he does a lot of walking around when he is at work and this will exacerbate his hemorrhoids. He also reports that when he was found to have anemia the hemoglobin was 8 when he was bleeding profusely and then when he stopped bleeding his follow-up hemoglobin was found to be 10. Despite this the patient's original iron saturation was 14 with a repeat at 4. The patient denies any unexplained weight loss nausea vomiting fevers or chills. He also denies any upper GI symptoms such as heartburn or early satiety. History of colon cancer colon polyps.  Past Medical History  Diagnosis Date  . External hemorrhoids without mention of complication   . Undiagnosed cardiac murmurs   . Dizziness   . Iron deficiency anemia     a. Hgb 6.0 on 05/18/14- started on ferrous fumarate, patient is a Jehovah's Witness    Past Surgical History  Procedure Laterality Date  . Colonoscopy    . Adenoidectomy    . Tonsillectomy    . Eye growth removal      Prior to Admission medications   Medication Sig Start Date End Date Taking? Authorizing Provider  ferrous sulfate 325 (65 FE) MG tablet Take 325 mg by mouth daily with breakfast.   Yes  Historical Provider, MD  psyllium (METAMUCIL SMOOTH TEXTURE) 28 % packet Take 1 packet by mouth 2 (two) times daily.   Yes Historical Provider, MD    Family History  Problem Relation Age of Onset  . Diabetes Mother   . Hypertension Mother   . Diabetes Father   . Hypertension Father   . Diabetes Maternal Grandmother      Social History  Substance Use Topics  . Smoking status: Never Smoker   . Smokeless tobacco: Never Used  . Alcohol Use: 0.6 oz/week    1 Glasses of wine per week    Allergies as of 04/03/2016  . (No Known Allergies)    Review of Systems:    All systems reviewed and negative except where noted in HPI.   Physical Exam:  BP 127/76 mmHg  Pulse 68  Temp(Src) 98.8 F (37.1 C) (Oral)  Ht 5\' 9"  (1.753 m)  Wt 184 lb (83.462 kg)  BMI 27.16 kg/m2 No LMP for male patient. Psych:  Alert and cooperative. Normal mood and affect. General:   Alert,  Well-developed, well-nourished, pleasant and cooperative in NAD Head:  Normocephalic and atraumatic. Eyes:  Sclera clear, no icterus.   Conjunctiva pink. Ears:  Normal auditory acuity. Nose:  No deformity, discharge, or lesions. Mouth:  No deformity or lesions,oropharynx pink & moist. Neck:  Supple;  no masses or thyromegaly. Lungs:  Respirations even and unlabored.  Clear throughout to auscultation.   No wheezes, crackles, or rhonchi. No acute distress. Heart:  Regular rate and rhythm; no murmurs, clicks, rubs, or gallops. Abdomen:  Normal bowel sounds.  No bruits.  Soft, non-tender and non-distended without masses, hepatosplenomegaly or hernias noted.  No guarding or rebound tenderness.  Negative Carnett sign.   Rectal:  Deferred.  Msk:  Symmetrical without gross deformities.  Good, equal movement & strength bilaterally. Pulses:  Normal pulses noted. Extremities:  No clubbing or edema.  No cyanosis. Neurologic:  Alert and oriented x3;  grossly normal neurologically. Skin:  Intact without significant lesions or rashes.   No jaundice. Lymph Nodes:  No significant cervical adenopathy. Psych:  Alert and cooperative. Normal mood and affect.  Imaging Studies: No results found.  Assessment and Plan:   Garrett Barajas is a 50 y.o. y/o male comes in today with a history of anemia with a iron saturation of 4 and low iron level of 21. The patient has rectal bleeding from hemorrhoids and is supposed to undergo a surgical ligation of these hemorrhoids. The patient will be set up for colonoscopy to evaluate the colon for other possible causes of anemia prior to having his surgery. I have discussed risks & benefits which include, but are not limited to, bleeding, infection, perforation & drug reaction.  The patient agrees with this plan & written consent will be obtained.      Note: This dictation was prepared with Dragon dictation along with smaller phrase technology. Any transcriptional errors that result from this process are unintentional.

## 2016-04-04 ENCOUNTER — Other Ambulatory Visit: Payer: Self-pay

## 2016-04-04 ENCOUNTER — Encounter: Payer: Self-pay | Admitting: *Deleted

## 2016-04-09 NOTE — Discharge Instructions (Signed)

## 2016-04-10 ENCOUNTER — Encounter
Admission: RE | Disposition: A | Discharge status: Home / Self Care | Payer: Self-pay | Source: Ambulatory Visit | Attending: Gastroenterology

## 2016-04-10 ENCOUNTER — Ambulatory Visit: Payer: BLUE CROSS/BLUE SHIELD | Admitting: Anesthesiology

## 2016-04-10 ENCOUNTER — Ambulatory Visit
Admission: RE | Admit: 2016-04-10 | Discharge: 2016-04-10 | Disposition: A | Discharge status: Home / Self Care | Payer: BLUE CROSS/BLUE SHIELD | Source: Ambulatory Visit | Attending: Gastroenterology | Admitting: Gastroenterology

## 2016-04-10 DIAGNOSIS — K644 Residual hemorrhoidal skin tags: Secondary | ICD-10-CM | POA: Insufficient documentation

## 2016-04-10 DIAGNOSIS — Z8249 Family history of ischemic heart disease and other diseases of the circulatory system: Secondary | ICD-10-CM | POA: Insufficient documentation

## 2016-04-10 DIAGNOSIS — Z833 Family history of diabetes mellitus: Secondary | ICD-10-CM | POA: Insufficient documentation

## 2016-04-10 DIAGNOSIS — K642 Third degree hemorrhoids: Secondary | ICD-10-CM | POA: Insufficient documentation

## 2016-04-10 DIAGNOSIS — D509 Iron deficiency anemia, unspecified: Secondary | ICD-10-CM | POA: Diagnosis not present

## 2016-04-10 DIAGNOSIS — K921 Melena: Secondary | ICD-10-CM | POA: Insufficient documentation

## 2016-04-10 DIAGNOSIS — Z9889 Other specified postprocedural states: Secondary | ICD-10-CM | POA: Diagnosis not present

## 2016-04-10 HISTORY — PX: COLONOSCOPY WITH PROPOFOL: SHX5780

## 2016-04-10 HISTORY — DX: Reserved for inherently not codable concepts without codable children: IMO0001

## 2016-04-10 HISTORY — DX: Procedure and treatment not carried out because of patient's decision for reasons of belief and group pressure: Z53.1

## 2016-04-10 SURGERY — COLONOSCOPY WITH PROPOFOL
Anesthesia: Monitor Anesthesia Care

## 2016-04-10 MED ORDER — STERILE WATER FOR IRRIGATION IR SOLN
Status: DC | PRN
  Administered 2016-04-10: 10:00:00

## 2016-04-10 MED ORDER — FAMOTIDINE 20 MG PO TABS: 20.0000 mg | ORAL_TABLET | Freq: Once | ORAL | Status: DC

## 2016-04-10 MED ORDER — LACTATED RINGERS IV SOLN
INTRAVENOUS | Status: DC
  Administered 2016-04-10: 09:00:00 via INTRAVENOUS

## 2016-04-10 MED ORDER — PROPOFOL 10 MG/ML IV BOLUS
INTRAVENOUS | Status: DC | PRN
  Administered 2016-04-10 (×7): 20 mg via INTRAVENOUS

## 2016-04-10 SURGICAL SUPPLY — 22 items
CANISTER SUCT 1200ML W/VALVE (MISCELLANEOUS) ×2 IMPLANT
CLIP HMST 235XBRD CATH ROT (MISCELLANEOUS) IMPLANT
CLIP RESOLUTION 360 11X235 (MISCELLANEOUS)
FCP ESCP3.2XJMB 240X2.8X (MISCELLANEOUS)
FORCEPS BIOP RAD 4 LRG CAP 4 (CUTTING FORCEPS) IMPLANT
FORCEPS BIOP RJ4 240 W/NDL (MISCELLANEOUS)
FORCEPS ESCP3.2XJMB 240X2.8X (MISCELLANEOUS) IMPLANT
GOWN CVR UNV OPN BCK APRN NK (MISCELLANEOUS) ×2 IMPLANT
GOWN ISOL THUMB LOOP REG UNIV (MISCELLANEOUS) ×2
INJECTOR VARIJECT VIN23 (MISCELLANEOUS) IMPLANT
KIT DEFENDO VALVE AND CONN (KITS) IMPLANT
KIT ENDO PROCEDURE OLY (KITS) ×2 IMPLANT
MARKER SPOT ENDO TATTOO 5ML (MISCELLANEOUS) IMPLANT
PAD GROUND ADULT SPLIT (MISCELLANEOUS) IMPLANT
PROBE APC STR FIRE (PROBE) IMPLANT
SNARE SHORT THROW 13M SML OVAL (MISCELLANEOUS) IMPLANT
SNARE SHORT THROW 30M LRG OVAL (MISCELLANEOUS) IMPLANT
SNARE SNG USE RND 15MM (INSTRUMENTS) IMPLANT
SPOT EX ENDOSCOPIC TATTOO (MISCELLANEOUS)
TRAP ETRAP POLY (MISCELLANEOUS) IMPLANT
VARIJECT INJECTOR VIN23 (MISCELLANEOUS)
WATER STERILE IRR 250ML POUR (IV SOLUTION) ×2 IMPLANT

## 2016-04-10 NOTE — Anesthesia Preprocedure Evaluation (Addendum)
Anesthesia Evaluation  Patient identified by MRN, date of birth, ID band Patient awake    Reviewed: Allergy & Precautions, H&P , NPO status , Patient's Chart, lab work & pertinent test results, reviewed documented beta blocker date and time   Airway Mallampati: II  TM Distance: >3 FB Neck ROM: full    Dental no notable dental hx.    Pulmonary neg pulmonary ROS,    Pulmonary exam normal breath sounds clear to auscultation       Cardiovascular Exercise Tolerance: Good  Rhythm:regular Rate:Normal     Neuro/Psych negative neurological ROS  negative psych ROS   GI/Hepatic negative GI ROS, Neg liver ROS,   Endo/Other  negative endocrine ROS  Renal/GU negative Renal ROS  negative genitourinary   Musculoskeletal   Abdominal   Peds  Hematology  (+) anemia , JEHOVAH'S WITNESSanemia HgB~10   Anesthesia Other Findings   Reproductive/Obstetrics negative OB ROS                            Anesthesia Physical Anesthesia Plan  ASA: II  Anesthesia Plan: MAC   Post-op Pain Management:    Induction:   Airway Management Planned:   Additional Equipment:   Intra-op Plan:   Post-operative Plan:   Informed Consent: I have reviewed the patients History and Physical, chart, labs and discussed the procedure including the risks, benefits and alternatives for the proposed anesthesia with the patient or authorized representative who has indicated his/her understanding and acceptance.     Plan Discussed with: CRNA  Anesthesia Plan Comments:        Anesthesia Quick Evaluation

## 2016-04-10 NOTE — Op Note (Signed)
[facility]astroenterology Patient Name: Garrett Barajas Procedure Date: 04/10/2016 9:24 AM MRN: [id number] Account #: [id number] Date of Birth: February 17, 1966 Admit Type: Outpatient Age: 50 Room: [facility] OR ROOM 01 Gender: Male Note Status: Finalized Procedure:            Colonoscopy Indications:          Hematochezia Providers:            Midge Minium, MD Medicines:            Propofol per Anesthesia Complications:        No immediate complications. Procedure:            Pre-Anesthesia Assessment:                       - Prior to the procedure, a History and Physical was                        performed, and patient medications and allergies were                        reviewed. The patient's tolerance of previous                        anesthesia was also reviewed. The risks and benefits of                        the procedure and the sedation options and risks were                        discussed with the patient. All questions were                        answered, and informed consent was obtained. Prior                        Anticoagulants: The patient has taken no previous                        anticoagulant or antiplatelet agents. ASA Grade                        Assessment: II - A patient with mild systemic disease.                        After reviewing the risks and benefits, the patient was                        deemed in satisfactory condition to undergo the                        procedure.                       After obtaining informed consent, the colonoscope was                        passed under direct vision. Throughout the procedure,                        the patient's blood pressure, pulse, and oxygen  saturations were monitored continuously. The Olympus                        CF-HQ190L Colonoscope (S#. [id number]) was introduced                        through the anus and advanced to the the cecum,   identified by appendiceal orifice and ileocecal valve.                        The colonoscopy was performed without difficulty. The                        patient tolerated the procedure well. The quality of                        the bowel preparation was excellent. Findings:      The perianal and digital rectal examinations were normal.      Non-bleeding internal hemorrhoids were found during retroflexion. The       hemorrhoids were moderate and Grade III (internal hemorrhoids that       prolapse but require manual reduction). Impression:           - Non-bleeding internal hemorrhoids.                       - No specimens collected. Recommendation:       - Repeat colonoscopy in 10 years for screening unless                        any change in family history or lower GI problems.                       - High fiber diet. Procedure Code(s):    --- Professional ---                       [id number], Colonoscopy, flexible; diagnostic, including                        collection of specimen(s) by brushing or washing, when                        performed (separate procedure) Diagnosis Code(s):    --- Professional ---                       K92.1, Melena (includes Hematochezia)                       K64.2, Third degree hemorrhoids CPT copyright 2016 American Medical Association. All rights reserved. The codes documented in this report are preliminary and upon coder review may  be revised to meet current compliance requirements. [name], MD 04/10/2016 9:46:57 AM This report has been signed electronically. Number of Addenda: 0 Note Initiated On: 04/10/2016 9:24 AM Scope Withdrawal Time: 0 hours 7 minutes 10 seconds  Total Procedure Duration: 0 hours 10 minutes 39 seconds       [facility]

## 2016-04-10 NOTE — Transfer of Care (Signed)
Immediate Anesthesia Transfer of Care Note  Patient: Garrett Barajas  Procedure(s) Performed: Procedure(s): COLONOSCOPY WITH PROPOFOL (N/A)  Patient Location: PACU  Anesthesia Type: MAC  Level of Consciousness: awake, alert  and patient cooperative  Airway and Oxygen Therapy: Patient Spontanous Breathing and Patient connected to supplemental oxygen  Post-op Assessment: Post-op Vital signs reviewed, Patient's Cardiovascular Status Stable, Respiratory Function Stable, Patent Airway and No signs of Nausea or vomiting  Post-op Vital Signs: Reviewed and stable  Complications: No apparent anesthesia complications

## 2016-04-10 NOTE — Anesthesia Postprocedure Evaluation (Signed)
Anesthesia Post Note  Patient: Garrett Barajas  Procedure(s) Performed: Procedure(s) (LRB): COLONOSCOPY WITH PROPOFOL (N/A)  Patient location during evaluation: PACU Anesthesia Type: MAC Level of consciousness: awake and alert Pain management: pain level controlled Vital Signs Assessment: post-procedure vital signs reviewed and stable Respiratory status: spontaneous breathing, nonlabored ventilation and respiratory function stable Cardiovascular status: blood pressure returned to baseline and stable Postop Assessment: no signs of nausea or vomiting Anesthetic complications: no    DANIEL D KOVACS

## 2016-04-10 NOTE — H&P (Signed)
  [facility]  [address] [place], [place] [address] Phone: [phone] Fax : [phone]  Primary Care Physician:  No PCP Per Patient Primary Gastroenterologist:  Dr. Servando SnareWohl  Pre-Procedure History & Physical: HPI:  Garrett Barajas is a 50 y.o. male is here for an colonoscopy.   Past Medical History  Diagnosis Date  . External hemorrhoids without mention of complication   . Undiagnosed cardiac murmurs   . Dizziness   . Iron deficiency anemia     a. Hgb 6.0 on 05/18/14- started on ferrous fumarate, patient is a Jehov[employer]'s Witness  . Refusal of blood transfusions as patient is Jehovah's Witness     Past Surgical History  Procedure Laterality Date  . Colonoscopy    . Adenoidectomy    . Tonsillectomy    . Eye growth removal      Prior to Admission medications   Medication Sig Start Date End Date Taking? Authorizing Provider  ferrous sulfate 325 (65 FE) MG tablet Take 325 mg by mouth daily with breakfast.   Yes Historical Provider, MD  psyllium (METAMUCIL SMOOTH TEXTURE) 28 % packet Take 1 packet by mouth 2 (two) times daily.   Yes Historical Provider, MD    Allergies as of 04/04/2016  . (No Known Allergies)    Family History  Problem Relation Age of Onset  . Diabetes Mother   . Hypertension Mother   . Diabetes Father   . Hypertension Father   . Diabetes Maternal Grandmother     Social History   Social History  . Marital Status: Married    Spouse Name: N/A  . Number of Children: N/A  . Years of Education: N/A   Occupational History  . Not on file.   Social History Main Topics  . Smoking status: Never Smoker   . Smokeless tobacco: Never Used  . Alcohol Use: 0.6 oz/week    1 Glasses of wine per week  . Drug Use: No  . Sexual Activity: Not on file   Other Topics Concern  . Not on file   Social History Narrative    Review of Systems: See HPI, otherwise negative ROS  Physical Exam: BP 124/73 mmHg  Pulse 66  Temp(Src) 97.9 F  (36.6 C)  Ht 5\' 9"  (1.753 m)  Wt 180 lb (81.647 kg)  BMI 26.57 kg/m2  SpO2 99% General:   Alert,  pleasant and cooperative in NAD Head:  Normocephalic and atraumatic. Neck:  Supple; no masses or thyromegaly. Lungs:  Clear throughout to auscultation.    Heart:  Regular rate and rhythm. Abdomen:  Soft, nontender and nondistended. Normal bowel sounds, without guarding, and without rebound.   Neurologic:  Alert and  oriented x4;  grossly normal neurologically.  Impression/Plan: Elita QuickJose Rachel is here for an colonoscopy to be performed for hematochezia  Risks, benefits, limitations, and alternatives regarding  colonoscopy have been reviewed with the patient.  Questions have been answered.  All parties agreeable.   Darlina Rumpfaren Ricco Dershem, MD  04/10/2016, 8:52 AM

## 2016-04-11 ENCOUNTER — Encounter: Payer: Self-pay | Admitting: Gastroenterology

## 2016-05-15 ENCOUNTER — Inpatient Hospital Stay (HOSPITAL_BASED_OUTPATIENT_CLINIC_OR_DEPARTMENT_OTHER): Payer: BLUE CROSS/BLUE SHIELD | Admitting: Oncology

## 2016-05-15 ENCOUNTER — Inpatient Hospital Stay: Payer: BLUE CROSS/BLUE SHIELD | Attending: Oncology

## 2016-05-15 ENCOUNTER — Inpatient Hospital Stay: Payer: BLUE CROSS/BLUE SHIELD

## 2016-05-15 VITALS — BP 132/72 | HR 64 | Temp 95.6°F | Resp 18 | Ht 69.0 in | Wt 183.9 lb

## 2016-05-15 DIAGNOSIS — Z79899 Other long term (current) drug therapy: Secondary | ICD-10-CM

## 2016-05-15 DIAGNOSIS — D500 Iron deficiency anemia secondary to blood loss (chronic): Secondary | ICD-10-CM | POA: Diagnosis present

## 2016-05-15 DIAGNOSIS — K644 Residual hemorrhoidal skin tags: Secondary | ICD-10-CM | POA: Diagnosis not present

## 2016-05-15 DIAGNOSIS — D509 Iron deficiency anemia, unspecified: Secondary | ICD-10-CM

## 2016-05-15 LAB — CBC WITH DIFFERENTIAL/PLATELET
Basophils Absolute: 0.1 10*3/uL (ref 0–0.1)
Basophils Relative: 2 %
Eosinophils Absolute: 0.5 10*3/uL (ref 0–0.7)
Eosinophils Relative: 10 %
HEMATOCRIT: 40.3 % (ref 40.0–52.0)
HEMOGLOBIN: 13.2 g/dL (ref 13.0–18.0)
LYMPHS ABS: 2.2 10*3/uL (ref 1.0–3.6)
LYMPHS PCT: 39 %
MCH: 25.2 pg — AB (ref 26.0–34.0)
MCHC: 32.7 g/dL (ref 32.0–36.0)
MCV: 77 fL — AB (ref 80.0–100.0)
MONOS PCT: 8 %
Monocytes Absolute: 0.4 10*3/uL (ref 0.2–1.0)
NEUTROS ABS: 2.3 10*3/uL (ref 1.4–6.5)
NEUTROS PCT: 41 %
Platelets: 249 10*3/uL (ref 150–440)
RBC: 5.23 MIL/uL (ref 4.40–5.90)
RDW: 27.1 % — ABNORMAL HIGH (ref 11.5–14.5)
WBC: 5.5 10*3/uL (ref 3.8–10.6)

## 2016-05-15 LAB — IRON AND TIBC
Iron: 265 ug/dL — ABNORMAL HIGH (ref 45–182)
Saturation Ratios: 54 % — ABNORMAL HIGH (ref 17.9–39.5)
TIBC: 489 ug/dL — AB (ref 250–450)
UIBC: 224 ug/dL

## 2016-05-15 LAB — FERRITIN: Ferritin: 15 ng/mL — ABNORMAL LOW (ref 24–336)

## 2016-05-15 NOTE — Progress Notes (Signed)
Pt reports he thinks he is feeling better he thinks his h&h are improving.  Pt reports having colonoscopy on 4/27

## 2016-05-16 ENCOUNTER — Telehealth: Payer: Self-pay | Admitting: General Surgery

## 2016-05-16 NOTE — Telephone Encounter (Signed)
Patient had surgery scheduled for March with Dr Tonita CongWoodham but it was cancelled because his iron was too low and he began seeing Dr Orlie DakinFinnegan. Patients wife is calling today wanting to reschedule surgery. I attempted to make an appointment to come see Dr Tonita CongWoodham, but she states that they saw Dr Orlie DakinFinnegan yesterday and that he said patient is clear and everything is fine to proceed with surgery and that Dr Orlie DakinFinnegan would be sending a report. She is not wanting to come in to see Dr Tonita CongWoodham again since [facility]. I wasn't sure if this was acceptable or not and she requested to speak with you.

## 2016-05-16 NOTE — Telephone Encounter (Signed)
I have spoke with patient's wife. I have advised her that the patient will need to be seen in the office prior to scheduling surgery per Dr Talmage CoinWoodham's request. Patient has not been seen in 3 months. Patient's wife understands. I have made the patient an appointment to see Dr Tonita CongWoodham on 05/29/16 @ 10:15 in [place] with Dr Tonita CongWoodham.

## 2016-05-21 NOTE — Progress Notes (Signed)
[facility]  [phone] [phone]  ID: Garrett Barajas OB: [birth year]  MR#: [id number]  GMW#:[id number]  Patient Care Team: No Pcp Per Patient as PCP - General (General Practice)  CHIEF COMPLAINT:  Chief Complaint  Patient presents with  . Follow-up    IDA    INTERVAL HISTORY: Patient returns to clinic today for further evaluation and laboratory work. He does not complain of any additional bright red blood per rectum.  He is a Jehovah witness and cannot receive blood products. He continues with dietary changes and states his energy levels are significantly improved. He currently feels well and is asymptomatic. He has no neurologic complaints. He denies any recent fevers or illnesses. He has a good appetite and denies weight loss. He has no chest pain or shortness of breath. He denies any nausea, vomiting, constipation, or diarrhea. He has no urinary complaints. Patient offers no further specific complaints.  REVIEW OF SYSTEMS:   Review of Systems  Constitutional: Negative for fever, weight loss and malaise/fatigue.  Respiratory: Negative.  Negative for shortness of breath.   Cardiovascular: Negative.  Negative for chest pain.  Gastrointestinal: Negative for abdominal pain, blood in stool and melena.  Genitourinary: Negative.   Musculoskeletal: Negative.   Neurological: Negative.  Negative for weakness.    As per HPI. Otherwise, a complete review of systems is negatve.  PAST MEDICAL HISTORY: Past Medical History  Diagnosis Date  . External hemorrhoids without mention of complication   . Undiagnosed cardiac murmurs   . Dizziness   . Iron deficiency anemia     a. Hgb 6.0 on 05/18/14- started on ferrous fumarate, patient is a Jehov[employer]'s Witness  . Refusal of blood transfusions as patient is Jehovah's Witness     PAST SURGICAL HISTORY: Past Surgical History  Procedure Laterality Date  . Colonoscopy    . Adenoidectomy    . Tonsillectomy      . Eye growth removal    . Colonoscopy with propofol N/A 04/10/2016    Procedure: COLONOSCOPY WITH PROPOFOL;  Surgeon: Midge Minium, MD;  [facility];  Service: Endoscopy;  Laterality: N/A;    FAMILY HISTORY Family History  Problem Relation Age of Onset  . Diabetes Mother   . Hypertension Mother   . Diabetes Father   . Hypertension Father   . Diabetes Maternal Grandmother        ADVANCED DIRECTIVES:    HEALTH MAINTENANCE: Social History  Substance Use Topics  . Smoking status: Never Smoker   . Smokeless tobacco: Never Used  . Alcohol Use: 0.6 oz/week    1 Glasses of wine per week     Colonoscopy:  PAP:  Bone density:  Lipid panel:  No Known Allergies  Current Outpatient Prescriptions  Medication Sig Dispense Refill  . ferrous sulfate 325 (65 FE) MG tablet Take 325 mg by mouth daily with breakfast.    . psyllium (METAMUCIL SMOOTH TEXTURE) 28 % packet Take 1 packet by mouth 2 (two) times daily.     No current facility-administered medications for this visit.    OBJECTIVE: Filed Vitals:   05/15/16 1350  BP: 132/72  Pulse: 64  Temp: 95.6 F (35.3 C)  Resp: 18     Body mass index is 27.14 kg/(m^2).    ECOG FS:0 - Asymptomatic  General: Well-developed, well-nourished, no acute distress. Eyes: Pink conjunctiva, anicteric sclera. Lungs: Clear to auscultation bilaterally. Heart: Regular rate and rhythm. No rubs, murmurs, or gallops. Abdomen: Soft, nontender, nondistended. No  organomegaly noted, normoactive bowel sounds. Musculoskeletal: No edema, cyanosis, or clubbing. Neuro: Alert, answering all questions appropriately. Cranial nerves grossly intact. Skin: No rashes or petechiae noted. Psych: Normal affect.   LAB RESULTS:  Lab Results  Component Value Date   NA 139 02/21/2016   K 3.9 02/21/2016   CL 105 02/21/2016   CO2 26 02/21/2016   GLUCOSE 90 02/21/2016   BUN 15 02/21/2016   CREATININE 0.81 02/21/2016   CALCIUM 8.9 02/21/2016    GFRNONAA >60 02/21/2016   GFRAA >60 02/21/2016    Lab Results  Component Value Date   WBC 5.5 05/15/2016   NEUTROABS 2.3 05/15/2016   HGB 13.2 05/15/2016   HCT 40.3 05/15/2016   MCV 77.0* 05/15/2016   PLT 249 05/15/2016   Lab Results  Component Value Date   IRON 265* 05/15/2016   TIBC 489* 05/15/2016   IRONPCTSAT 54* 05/15/2016    Lab Results  Component Value Date   FERRITIN 15* 05/15/2016     STUDIES: No results found.  ASSESSMENT: Iron deficiency anemia secondary to blood loss.  PLAN:    1. Iron deficiency anemia: Patient's states he started taking oral iron supplementation and changed his diet. His hemoglobin is now within normal limits and his iron stores are also improving. Patient continues to decline IV Feraheme and wishes to pursue oral supplementation and dietary modification. Previously, the remainder of his blood work was either negative or within normal limits. Patient reports a normal colonoscopy in April 2017.  Patient is a Jehovah witness therefore cannot receive blood products. After discussion with the patient, no further follow-up is necessary. Please refer him back if his hemoglobin and iron stores (trending down. 2. Hemorrhoids: Treatment per surgery.  Patient expressed understanding and was in agreement with this plan. He also understands that He can call clinic at any time with any questions, concerns, or complaints.    Jeralyn Ruthsimothy J Finnegan, MD   05/21/2016 10:37 PM

## 2016-05-29 ENCOUNTER — Encounter: Payer: Self-pay | Admitting: General Surgery

## 2016-05-29 ENCOUNTER — Telehealth: Payer: Self-pay | Admitting: General Surgery

## 2016-05-29 ENCOUNTER — Ambulatory Visit (INDEPENDENT_AMBULATORY_CARE_PROVIDER_SITE_OTHER): Payer: BLUE CROSS/BLUE SHIELD | Admitting: General Surgery

## 2016-05-29 VITALS — BP 133/82 | HR 65 | Temp 97.8°F | Wt 182.0 lb

## 2016-05-29 DIAGNOSIS — K648 Other hemorrhoids: Secondary | ICD-10-CM | POA: Diagnosis not present

## 2016-05-29 DIAGNOSIS — K642 Third degree hemorrhoids: Secondary | ICD-10-CM

## 2016-05-29 NOTE — Patient Instructions (Signed)
Please refer to the blue sheet to call [facility] to know at what time you need to show up for your surgery.    Surgical Procedures for Hemorrhoids, Care After  Refer to this sheet in the next few weeks. These instructions provide you with information about caring for yourself after your procedure. Your health care provider may also give you more specific instructions. Your treatment has been planned according to current medical practices, but problems sometimes occur. Call your health care provider if you have any problems or questions after your procedure. WHAT TO EXPECT AFTER THE PROCEDURE After your procedure, it is common to have:  Rectal pain.  Pain when you are having a bowel movement.  Slight rectal bleeding. HOME CARE INSTRUCTIONS Medicines  Take over-the-counter and prescription medicines only as told by your health care provider.  Do not drive or operate heavy machinery while taking prescription pain medicine.  Use a stool softener or a bulk laxative as told by your health care provider. Activities  Rest at home. Return to your normal activities as told by your health care provider.  Do not lift anything that is heavier than 10 lb (4.5 kg).  Do not sit for long periods of time. Take a walk every day or as told by your health care provider.  Do not strain to have a bowel movement. Do not spend a long time sitting on the toilet. Eating and Drinking  Eat foods that contain fiber, such as whole grains, beans, nuts, fruits, and vegetables.  Drink enough fluid to keep your urine clear or pale yellow. General Instructions  Sit in a warm bath 2-3 times per day to relieve soreness or itching.  Keep all follow-up visits as told by your health care provider. This is important. SEEK MEDICAL CARE IF:  Your pain medicine is not helping.  You have a fever or chills.  You become constipated.  You have trouble passing urine. SEEK IMMEDIATE MEDICAL CARE IF:  You have very bad  rectal pain.  You have heavy bleeding from your rectum.   This information is not intended to replace advice given to you by your health care provider. Make sure you discuss any questions you have with your health care provider.   Document Released: 02/21/2004 Document Revised: 08/22/2015 Document Reviewed: 02/26/2015 Elsevier Interactive Patient Education [employer]2016 Elsevier Inc.

## 2016-05-29 NOTE — Telephone Encounter (Signed)
Pt advised of pre op date/time and sx date. Sx: 06/09/16 with Dr Sedonia SmallWoodham--Open hemorrhoidectomy.  Pre op: 06/03/16 between 9-1:00 pm-- phone.   Patient made aware to call [phone], between 1-3:00 pm the day before surgery, to find out what time to arrive.

## 2016-05-29 NOTE — Progress Notes (Signed)
Outpatient Surgical Follow Up  05/29/2016  Garrett Barajas is an 50 y.o. male.   Chief Complaint  Patient presents with  . Follow-up    Hemorrhoid    HPI: 50-year-old male well-known the surgery service returns to clinic for evaluation of his hemorrhoids. Patient is a Jehovah's Witness and was previously scheduled for hemorrhoidectomy was found to be profoundly anemic at that time. Since that time he has been evaluated by hematology and started on iron with a robust response. His most recent blood count was in the normal range. Patient reports that being on the iron he has work extra hard to maintain regularity and has had a symptomatic worsening of his hemorrhoids. Primarily on the patient's right he notices that the area protrude more and after every bowel movement he has to reduce them himself. Minimal bleeding with wiping currently. He denies any fevers, chills, nausea, vomiting, diarrhea, aspiration, chest pain, short of breath. He is very interested in having these hemorrhoids removed.  Past Medical History  Diagnosis Date  . External hemorrhoids without mention of complication   . Undiagnosed cardiac murmurs   . Dizziness   . Iron deficiency anemia     a. Hgb 6.0 on 05/18/14- started on ferrous fumarate, patient is a Jehovah's Witness  . Refusal of blood transfusions as patient is Jehovah's Witness     Past Surgical History  Procedure Laterality Date  . Colonoscopy    . Adenoidectomy    . Tonsillectomy    . Eye growth removal    . Colonoscopy with propofol N/A 04/10/2016    Procedure: COLONOSCOPY WITH PROPOFOL;  Surgeon: Darren Wohl, MD;  Location: [facility];  Service: Endoscopy;  Laterality: N/A;    Family History  Problem Relation Age of Onset  . Diabetes Mother   . Hypertension Mother   . Diabetes Father   . Hypertension Father   . Diabetes Maternal Grandmother     Social History:  reports that he has never smoked. He has never used smokeless tobacco. He  reports that he drinks about 0.6 oz of alcohol per week. He reports that he does not use illicit drugs.  Allergies: No Known Allergies  Medications reviewed.    ROS A multipoint review of systems was completed. All pertinent positives and negatives are documented within the history of present illness remainder are negative.   BP 133/82 mmHg  Pulse 65  Temp(Src) 97.8 F (36.6 C) (Oral)  Wt 82.555 kg (182 lb)  Physical Exam  Gen.: No acute distress Chest: Clear to auscultation Heart: Regular rate and rhythm Abdomen: Soft and nontender Rectum: Rectal exam reveals 3 column hemorrhoidal disease. On the patient's right there is a visible grade 3 right posterior hemorrhoid column. The left side appears to be grade 2. No active bleeding and normal sphincter tone.   No results found for this or any previous visit (from the past 48 hour(s)). No results found.  Assessment/Plan:  1. Prolapsed internal hemorrhoids, grade 3 50-year-old male with symptomatic grade 3 internal hemorrhoids. Had a long conversation with patient about the surgery for an open hemorrhoidectomy to include the risks, benefits, alternatives. Discussed at length the primary risk from the surgery as prolonged pain and bleeding. Patient voiced understanding and wishes to proceed. We will plan for an open hemorrhoidectomy on Monday, June 26.     Garrett Devincenzi, MD FACS General Surgeon  05/29/2016,10:55 AM   

## 2016-06-03 ENCOUNTER — Encounter: Payer: Self-pay | Admitting: *Deleted

## 2016-06-03 ENCOUNTER — Inpatient Hospital Stay: Admission: RE | Admit: 2016-06-03 | Payer: BLUE CROSS/BLUE SHIELD | Source: Ambulatory Visit

## 2016-06-03 NOTE — Patient Instructions (Signed)
  Your procedure is scheduled on: 06-09-16 Report to [facility] 2ND FLOOR To find out your arrival time please call [phone] between 1PM - 3PM on 06-06-16  Remember: Instructions that are not followed completely may result in serious medical risk, up to and including death, or upon the discretion of your surgeon and anesthesiologist your surgery may need to be rescheduled.    _X___ 1. Do not eat food or drink liquids after midnight. No gum chewing or hard candies.     _X___ 2. No Alcohol for 24 hours before or after surgery.   ____ 3. Do Not Smoke For 24 Hours Prior to Your Surgery.   ____ 4. Bring all medications with you on the day of surgery if instructed.    ____ 5. Notify your doctor if there is any change in your medical condition     (cold, fever, infections).       Do not wear jewelry, make-up, hairpins, clips or nail polish.  Do not wear lotions, powders, or perfumes. You may wear deodorant.  Do not shave 48 hours prior to surgery. Men may shave face and neck.  Do not bring valuables to the hospital.    [facility] is not responsible for any belongings or valuables.               Contacts, dentures or bridgework may not be worn into surgery.  Leave your suitcase in the car. After surgery it may be brought to your room.  For patients admitted to the hospital, discharge time is determined by your treatment team.   Patients discharged the day of surgery will not be allowed to drive home.   Please read over the following fact sheets that you were given:      ____ Take these medicines the morning of surgery with A SIP OF WATER:    1. NONE  2.   3.   4.  5.  6.  _X___ Fleet Enema (as directed)-1 HOUR PRIOR TO ARRIVAL TIME TO HOSPITAL   ____ Use CHG Soap as directed  ____ Use inhalers on the day of surgery  ____ Stop metformin 2 days prior to surgery    ____ Take 1/2 of usual insulin dose the night before surgery and none on the morning of  surgery.   ____ Stop Coumadin/Plavix/aspirin-N/A  _X___ Stop Anti-inflammatories-NO NSAIDS OR ASA PRODUCTS-TYLENOL OK TO TAKE   ____ Stop supplements until after surgery.    ____ Bring C-Pap to the hospital.

## 2016-06-06 NOTE — Pre-Procedure Instructions (Signed)
Result Notes    Notes Recorded by Fransico Setters, RN on 06/28/2014 at 11:33 AM Reviewed results with patient.   ------  Notes Recorded by Iran Ouch, MD on 06/27/2014 at 1:36 PM Inform patient that echo was normal. ------  Notes Recorded by Fransico Setters, RN on 06/26/2014 at 8:14 AM RN reviewed. Forwarded to MD.  ------  Notes Recorded by Fransico Setters, RN on 06/26/2014 at 8:13 AM RN reviewed. Forwarded to MD.     Sarita Bottom Result    Result status: Final result                  *[facility]*         [address]            [place], [place] [address]              [phone]  ------------------------------------------------------------------- Transthoracic Echocardiography  Patient:  Garrett Barajas, Garrett Barajas MR #:    [id number] Study Date: 06/23/2014 Gender:   M Age:    50 Height:   177.8 cm Weight:   81.6 kg BSA:    2.02 m^2 Pt. Status: Room:  ATTENDING  Lorine Bears, MD ORDERING   Lorine Bears, MD REFERRING  Lorine Bears, MD SONOGRAPHER Weston Settle, RDCS, RVT PERFORMING  [facility] [place]  cc:  ------------------------------------------------------------------- LV EF: 55% -  65%  ------------------------------------------------------------------- History:  PMH: Acquired from the patient and from the patient&'s chart. Lightheadedness and murmur.  ------------------------------------------------------------------- Study Conclusions  - Left ventricle: The cavity size was normal. Systolic function was normal. The estimated ejection fraction was in the range of 55% to 65%. Wall motion was normal; there were no regional wall motion abnormalities. Left ventricular diastolic function parameters were normal. - Left atrium: The atrium was normal in size. - Right ventricle: Systolic function was normal. - Pulmonary arteries: Systolic pressure was within  the normal range.  Impressions:  - Normal study.  Transthoracic echocardiography. M-mode, complete 2D, spectral Doppler, and color Doppler. Birthdate: Patient birthdate: 1966-12-03. Age: Patient is 50 yr old. Sex: Gender: male. Height: Height: 177.8 cm. Height: 70 in. Weight: Weight: 81.6 kg. Weight: 179.6 lb. Body mass index: BMI: 25.8 kg/m^2. Body surface area:  BSA: 2.02 m^2. Blood pressure:   122/84 Patient status: Outpatient. Study date: Study date: 06/23/2014. Study time: 03:36 PM.  -------------------------------------------------------------------  ------------------------------------------------------------------- Left ventricle: The cavity size was normal. Systolic function was normal. The estimated ejection fraction was in the range of 55% to 65%. Wall motion was normal; there were no regional wall motion abnormalities. The transmitral flow pattern was normal. The deceleration time of the early transmitral flow velocity was normal. The pulmonary vein flow pattern was normal. The tissue Doppler parameters were normal. Left ventricular diastolic function parameters were normal.  ------------------------------------------------------------------- Aortic valve:  Trileaflet; mildly thickened leaflets. Mobility was not restricted. Doppler: Transvalvular velocity was within the normal range. There was no stenosis. There was no regurgitation. Valve area (VTI): 2.04 cm^2. Indexed valve area (VTI): 1.01 cm^2/m^2. Valve area (Vmax): 2.01 cm^2. Indexed valve area (Vmax): 1 cm^2/m^2.  Mean gradient (S): 4 mm Hg.  ------------------------------------------------------------------- Aorta: Aortic root: The aortic root was normal in size.  ------------------------------------------------------------------- Mitral valve:  Structurally normal valve.  Mobility was not restricted. Doppler: Transvalvular velocity was within the normal range. There was  no evidence for stenosis. There was trivial regurgitation.  Peak gradient (D): 3 mm Hg.  ------------------------------------------------------------------- Left atrium: The atrium was normal in size.  ------------------------------------------------------------------- Right ventricle:  The cavity size was normal. Wall thickness was normal. Systolic function was normal.  ------------------------------------------------------------------- Pulmonic valve:  Structurally normal valve.  Cusp separation was normal. Doppler: Transvalvular velocity was within the normal range. There was no evidence for stenosis. There was no regurgitation.  ------------------------------------------------------------------- Tricuspid valve:  Structurally normal valve.  Doppler: Transvalvular velocity was within the normal range. There was trivial regurgitation.  ------------------------------------------------------------------- Pulmonary artery:  The main pulmonary artery was normal-sized. Systolic pressure was within the normal range.  ------------------------------------------------------------------- Right atrium: The atrium was normal in size.  ------------------------------------------------------------------- Pericardium: There was no pericardial effusion.  ------------------------------------------------------------------- Systemic veins: Inferior vena cava: The vessel was normal in size.  ------------------------------------------------------------------- Prepared and Electronically Authenticated by  Dossie Arbour, MD, [facility] 2015-07-13T07:47:41  ------------------------------------------------------------------- Measurements  Left ventricle              Value     Reference LV ID, ED, PLAX chordal      (L)   42  mm    43 - 52 LV ID, ES, PLAX chordal      (N)   29.2 mm    23 - 38 LV fx shortening, PLAX chordal  (N)   30  %     >=29 LV PW thickness, ED            9.56 mm    --------- IVS/LV PW ratio, ED        (N)   0.94      <=1.3 Stroke volume, 2D             57  ml    --------- Stroke volume/bsa, 2D           28  ml/m^2  ---------  Ventricular septum            Value     Reference IVS thickness, ED             9   mm    ---------  LVOT                   Value     Reference LVOT ID, S                20  mm    --------- LVOT area                 3.14 cm^2   ---------  Aortic valve               Value     Reference Aortic valve mean velocity, S       86.1 cm/s   --------- Aortic valve VTI, S            27.8 cm    --------- Aortic mean gradient, S          4   mm Hg  --------- Aortic valve area, VTI          2.04 cm^2   --------- Aortic valve area/bsa, VTI        1.01 cm^2/m^2 --------- Aortic valve area, peak velocity     2.01 cm^2   --------- Aortic valve area/bsa, peak        1   cm^2/m^2 --------- velocity  Aorta                   Value     Reference Aortic root ID, ED            31  mm    ---------  Left atrium                Value     Reference LA ID, A-P, ES              32  mm    --------- LA ID/bsa, A-P          (N)   1.59 cm/m^2  <=2.2 LA volume, S               33  ml    --------- LA volume/bsa, S             16.3 ml/m^2  ---------  Mitral valve               Value     Reference Mitral E-wave peak velocity        88.4 cm/s   --------- Mitral A-wave peak velocity        58.2 cm/s   --------- Mitral deceleration time     (N)   190  ms     150 - 230 Mitral peak gradient, D          3   mm Hg  --------- Mitral E/A ratio, peak          1.5      ---------  Pulmonic valve              Value     Reference Pulmonic valve peak velocity, S      112  cm/s   ---------  Legend: (L) and (H) mark values outside specified reference range.  (N) marks values inside specified reference range.    Patient Information    Patient Name Sex DOB SSN   Loman [place], Orhan Male 25-Dec-1965 [id number]    Reason For Exam  Priority: Routine   Dx: Murmur [R01.1 (ICD-10-CM)]    Surgical History    Procedure Laterality Date   COLONOSCOPY     ADENOIDECTOMY     TONSILLECTOMY     eye growth removal     COLONOSCOPY WITH PROPOFOL N/A 04/10/2016   Procedure: COLONOSCOPY WITH PROPOFOL; Surgeon: Midge Miniumarren Wohl, MD; [facility]; Service: Endoscopy; Laterality: N/A;     Performing Technologist/Nurse    Performing Technologist/Nurse:     Implants     No active implants to display in this view.    Order-Level Documents:    There are no order-level documents.    Encounter-Level Documents - 06/23/2014:      Electronic signature on 06/23/2014 3:26 PM    Printable Result Report    Result Report    2D Echocardiogram without contrast (Order [id number])  Echocardiography  Order: [id number]   Released By: Holley Dexterracy Tekely  Authorizing: Iran OuchMuhammad A Arida, MD   Date: 06/23/2014  [facility]       Result Notes     Notes Recorded by Fransico SettersNicole E Baucom, RN on 06/28/2014 at 11:33 AM Reviewed results with patient.   ------  Notes Recorded by Iran OuchMuhammad A Arida, MD on 06/27/2014 at 1:36 PM Inform patient that echo was normal. ------  Notes Recorded by Fransico SettersNicole E Baucom, RN on 06/26/2014 at 8:14 AM RN reviewed. Forwarded to MD.  ------  Notes Recorded by Fransico SettersNicole E Baucom, RN on 06/26/2014 at 8:13 AM RN reviewed. Forwarded to  MD.      Order Information     Order Date/Time Release Date/Time Start Date/Time End Date/Time    06/12/14 02:30 PM 06/23/14 03:26 PM 06/23/14 03:26 PM  None      Order Details     Frequency Duration Priority Order Class    None None Routine Clinic Performed      Acc#    [id number]      Order Questions     Question Answer Comment    Type of Echo Complete     Reason for Exam Murmur     Where should this test be performed [facility]       Associated Diagnoses       ICD-9-CM ICD-10-CM    Murmur    785.2 R01.1      Authorizing Provider Audit Trail     Date/Time Authorizing Provider Changed by    06/23/2014 3:26 PM Iran OuchMuhammad A Arida, MD Iran OuchMuhammad A Arida, MD      Order Requisition     2D ECHOCARDIOGRAM WITHOUT CONTRAST (Order [id number]) on 06/23/14     Collection Information     Collected: 06/23/2014 3:36 PM   Resulting Agency: [facility]       View SmartLink Info     2D ECHOCARDIOGRAM WITHOUT CONTRAST (Order #[id number]) on 06/23/14

## 2016-06-09 ENCOUNTER — Ambulatory Visit
Admission: RE | Admit: 2016-06-09 | Discharge: 2016-06-09 | Disposition: A | Discharge status: Home / Self Care | Payer: BLUE CROSS/BLUE SHIELD | Source: Ambulatory Visit | Attending: General Surgery | Admitting: General Surgery

## 2016-06-09 ENCOUNTER — Ambulatory Visit: Payer: BLUE CROSS/BLUE SHIELD | Admitting: Certified Registered"

## 2016-06-09 ENCOUNTER — Encounter: Payer: Self-pay | Admitting: *Deleted

## 2016-06-09 ENCOUNTER — Encounter
Admission: RE | Disposition: A | Discharge status: Home / Self Care | Payer: Self-pay | Source: Ambulatory Visit | Attending: General Surgery

## 2016-06-09 DIAGNOSIS — K644 Residual hemorrhoidal skin tags: Secondary | ICD-10-CM | POA: Insufficient documentation

## 2016-06-09 DIAGNOSIS — K642 Third degree hemorrhoids: Secondary | ICD-10-CM | POA: Diagnosis not present

## 2016-06-09 DIAGNOSIS — D509 Iron deficiency anemia, unspecified: Secondary | ICD-10-CM | POA: Insufficient documentation

## 2016-06-09 HISTORY — PX: HEMORRHOID SURGERY: SHX153

## 2016-06-09 SURGERY — HEMORRHOIDECTOMY
Anesthesia: General | Wound class: Clean Contaminated

## 2016-06-09 MED ORDER — LIDOCAINE HCL (CARDIAC) 20 MG/ML IV SOLN
INTRAVENOUS | Status: DC | PRN
  Administered 2016-06-09: 100 mg via INTRAVENOUS

## 2016-06-09 MED ORDER — CEFAZOLIN SODIUM-DEXTROSE 2-4 GM/100ML-% IV SOLN
INTRAVENOUS | Status: AC
  Administered 2016-06-09: 2 g via INTRAVENOUS
  Filled 2016-06-09: qty 100

## 2016-06-09 MED ORDER — BUPIVACAINE LIPOSOME 1.3 % IJ SUSP
INTRAMUSCULAR | Status: AC
  Filled 2016-06-09: qty 20

## 2016-06-09 MED ORDER — HYDROCODONE-ACETAMINOPHEN 5-325 MG PO TABS: 1.0000 | ORAL_TABLET | Freq: Four times a day (QID) | ORAL | Status: DC | PRN

## 2016-06-09 MED ORDER — FLEET ENEMA 7-19 GM/118ML RE ENEM: 1.0000 | ENEMA | Freq: Once | RECTAL | Status: DC

## 2016-06-09 MED ORDER — SODIUM CHLORIDE 0.9 % IJ SOLN
INTRAMUSCULAR | Status: AC
Start: 2016-06-09 — End: 2016-06-09
  Filled 2016-06-09: qty 50

## 2016-06-09 MED ORDER — FAMOTIDINE 20 MG PO TABS
20.0000 mg | ORAL_TABLET | Freq: Once | ORAL | Status: AC
  Administered 2016-06-09: 20 mg via ORAL

## 2016-06-09 MED ORDER — LACTATED RINGERS IV SOLN
INTRAVENOUS | Status: DC
  Administered 2016-06-09: 12:00:00 via INTRAVENOUS

## 2016-06-09 MED ORDER — OXYCODONE HCL 5 MG PO TABS: 5.0000 mg | ORAL_TABLET | Freq: Once | ORAL | Status: DC | PRN

## 2016-06-09 MED ORDER — ACETAMINOPHEN 10 MG/ML IV SOLN
INTRAVENOUS | Status: AC
  Filled 2016-06-09: qty 100

## 2016-06-09 MED ORDER — BUPIVACAINE LIPOSOME 1.3 % IJ SUSP
INTRAMUSCULAR | Status: DC | PRN
  Administered 2016-06-09: 40 mL

## 2016-06-09 MED ORDER — GELATIN ABSORBABLE 100 CM EX MISC
CUTANEOUS | Status: AC
  Filled 2016-06-09: qty 1

## 2016-06-09 MED ORDER — CEFAZOLIN SODIUM-DEXTROSE 2-4 GM/100ML-% IV SOLN
2.0000 g | INTRAVENOUS | Status: AC
  Administered 2016-06-09: 2 g via INTRAVENOUS

## 2016-06-09 MED ORDER — MIDAZOLAM HCL 2 MG/2ML IJ SOLN
INTRAMUSCULAR | Status: DC | PRN
  Administered 2016-06-09: 2 mg via INTRAVENOUS

## 2016-06-09 MED ORDER — CHLORHEXIDINE GLUCONATE 4 % EX LIQD: 60.0000 mL | Freq: Once | CUTANEOUS | Status: DC

## 2016-06-09 MED ORDER — ACETAMINOPHEN 10 MG/ML IV SOLN
INTRAVENOUS | Status: DC | PRN
  Administered 2016-06-09: 1000 mg via INTRAVENOUS

## 2016-06-09 MED ORDER — ONDANSETRON HCL 4 MG/2ML IJ SOLN
INTRAMUSCULAR | Status: DC | PRN
  Administered 2016-06-09: 4 mg via INTRAVENOUS

## 2016-06-09 MED ORDER — PROPOFOL 10 MG/ML IV BOLUS
INTRAVENOUS | Status: DC | PRN
  Administered 2016-06-09: 160 mg via INTRAVENOUS

## 2016-06-09 MED ORDER — FAMOTIDINE 20 MG PO TABS
ORAL_TABLET | ORAL | Status: AC
  Administered 2016-06-09: 20 mg via ORAL
  Filled 2016-06-09: qty 1

## 2016-06-09 MED ORDER — FENTANYL CITRATE (PF) 100 MCG/2ML IJ SOLN: 25.0000 ug | INTRAMUSCULAR | Status: DC | PRN

## 2016-06-09 MED ORDER — FENTANYL CITRATE (PF) 100 MCG/2ML IJ SOLN
INTRAMUSCULAR | Status: DC | PRN
  Administered 2016-06-09: 50 ug via INTRAVENOUS
  Administered 2016-06-09 (×2): 25 ug via INTRAVENOUS

## 2016-06-09 MED ORDER — LIDOCAINE-EPINEPHRINE 1 %-1:100000 IJ SOLN
INTRAMUSCULAR | Status: AC
  Filled 2016-06-09: qty 1

## 2016-06-09 MED ORDER — OXYCODONE HCL 5 MG/5ML PO SOLN: 5.0000 mg | Freq: Once | ORAL | Status: DC | PRN

## 2016-06-09 SURGICAL SUPPLY — 32 items
BRIEF STRETCH MATERNITY 2XLG (MISCELLANEOUS) ×2 IMPLANT
CANISTER SUCT 1200ML W/VALVE (MISCELLANEOUS) ×2 IMPLANT
CORD SCALPEL HARMONIC (MISCELLANEOUS) ×1 IMPLANT
CUP MEDICINE 2OZ PLAST GRAD ST (MISCELLANEOUS) IMPLANT
DRAPE LAPAROTOMY 100X77 ABD (DRAPES) ×2 IMPLANT
DRAPE LEGGINS SURG 28X43 STRL (DRAPES) ×2 IMPLANT
DRAPE UNDER BUTTOCK W/FLU (DRAPES) ×2 IMPLANT
ELECT CAUTERY BLADE TIP 2.5 (TIP) ×2
ELECT REM PT RETURN 9FT ADLT (ELECTROSURGICAL) ×2
ELECTRODE CAUTERY BLDE TIP 2.5 (TIP) ×1 IMPLANT
ELECTRODE REM PT RTRN 9FT ADLT (ELECTROSURGICAL) ×1 IMPLANT
GLOVE BIO SURGEON STRL SZ7.5 (GLOVE) ×4 IMPLANT
GLOVE INDICATOR 8.0 STRL GRN (GLOVE) ×4 IMPLANT
GOWN STRL REUS W/ TWL LRG LVL3 (GOWN DISPOSABLE) ×2 IMPLANT
GOWN STRL REUS W/TWL LRG LVL3 (GOWN DISPOSABLE) ×2
HARMONIC SCALPEL CORD (MISCELLANEOUS) ×2
HARMONIC SCALPEL FOCUS (MISCELLANEOUS) ×2 IMPLANT
KIT RM TURNOVER STRD PROC AR (KITS) ×2 IMPLANT
LABEL OR SOLS (LABEL) ×2 IMPLANT
NEEDLE HYPO 25X1 1.5 SAFETY (NEEDLE) ×2 IMPLANT
NS IRRIG 500ML POUR BTL (IV SOLUTION) ×2 IMPLANT
PACK BASIN MINOR ARMC (MISCELLANEOUS) ×2 IMPLANT
PAD ABD DERMACEA PRESS 5X9 (GAUZE/BANDAGES/DRESSINGS) ×2 IMPLANT
PAD TELFA 2X3 NADH STRL (GAUZE/BANDAGES/DRESSINGS) ×2 IMPLANT
SOL PREP PVP 2OZ (MISCELLANEOUS) ×2
SOLUTION PREP PVP 2OZ (MISCELLANEOUS) ×1 IMPLANT
SURGILUBE 2OZ TUBE FLIPTOP (MISCELLANEOUS) ×2 IMPLANT
SUT CHROMIC 3 0 SH 27 (SUTURE) ×4 IMPLANT
SWABSTK COMLB BENZOIN TINCTURE (MISCELLANEOUS) IMPLANT
SYR 50ML LL SCALE MARK (SYRINGE) ×2 IMPLANT
SYR BULB EAR ULCER 3OZ GRN STR (SYRINGE) ×2 IMPLANT
SYRINGE 10CC LL (SYRINGE) ×2 IMPLANT

## 2016-06-09 NOTE — Brief Op Note (Signed)
06/09/2016  1:18 PM  PATIENT:  Garrett Barajas  50 y.o. male  PRE-OPERATIVE DIAGNOSIS:  PROLAPSED INTERNAL HEMORRHOIDS  POST-OPERATIVE DIAGNOSIS:  PROLAPSED INTERNAL HEMORRHOIDS  PROCEDURE:  Procedure(s): HEMORRHOIDECTOMY (N/A)  SURGEON:  Surgeon(s) and Role:    * Ricarda Frameharles Janaysha Depaulo, MD - Primary  PHYSICIAN ASSISTANT:   ASSISTANTS: none   ANESTHESIA:   general  EBL:  Total I/O In: 600 [I.V.:600] Out: 5 [Blood:5]  BLOOD ADMINISTERED:none  DRAINS: none   LOCAL MEDICATIONS USED:  OTHER exparel 40ml  SPECIMEN:  Source of Specimen:  hemorrhoid collum x 2  DISPOSITION OF SPECIMEN:  PATHOLOGY  COUNTS:  YES  TOURNIQUET:  * No tourniquets in log *  DICTATION: .Dragon Dictation  PLAN OF CARE: Discharge to home after PACU  PATIENT DISPOSITION:  PACU - hemodynamically stable.   Delay start of Pharmacological VTE agent (>24hrs) due to surgical blood loss or risk of bleeding: no

## 2016-06-09 NOTE — Anesthesia Procedure Notes (Signed)
Procedure Name: LMA Insertion Performed by: Casey BurkittHOANG, Calin Ellery Pre-anesthesia Checklist: Patient identified, Emergency Drugs available, Suction available, Patient being monitored and Timeout performed Patient Re-evaluated:Patient Re-evaluated prior to inductionOxygen Delivery Method: Circle system utilized Preoxygenation: Pre-oxygenation with 100% oxygen Intubation Type: IV induction LMA: LMA inserted LMA Size: 4.5 Number of attempts: 1 Placement Confirmation: positive ETCO2 and breath sounds checked- equal and bilateral Tube secured with: Tape Dental Injury: Teeth and Oropharynx as per pre-operative assessment

## 2016-06-09 NOTE — Anesthesia Preprocedure Evaluation (Signed)
Anesthesia Evaluation  Patient identified by MRN, date of birth, ID band Patient awake    Reviewed: Allergy & Precautions, H&P , NPO status , Patient's Chart, lab work & pertinent test results  History of Anesthesia Complications Negative for: history of anesthetic complications  Airway Mallampati: II  TM Distance: <3 FB Neck ROM: full    Dental  (+) Poor Dentition   Pulmonary neg pulmonary ROS, neg shortness of breath,    Pulmonary exam normal breath sounds clear to auscultation       Cardiovascular Exercise Tolerance: Good (-) anginanegative cardio ROS Normal cardiovascular exam Rhythm:regular Rate:Normal     Neuro/Psych negative neurological ROS  negative psych ROS   GI/Hepatic negative GI ROS, Neg liver ROS,   Endo/Other  negative endocrine ROS  Renal/GU negative Renal ROS  negative genitourinary   Musculoskeletal   Abdominal   Peds  Hematology negative hematology ROS (+)   Anesthesia Other Findings Past Medical History:   External hemorrhoids without mention of compli*              Dizziness                                                    Iron deficiency anemia                                         Comment:a. Hgb 6.0 on 05/18/14- started on ferrous               fumarate, patient is a Jehovah's Witness   Refusal of blood transfusions as patient is Je*              Undiagnosed cardiac murmurs                                    Comment:HAD W/U DONE (EKG, ECHO) ALL NORMAL   Past Surgical History:   COLONOSCOPY                                                   ADENOIDECTOMY                                                 TONSILLECTOMY                                                 eye growth removal                                            COLONOSCOPY WITH PROPOFOL  N/A 04/10/2016      Comment:Procedure: COLONOSCOPY WITH PROPOFOL;  Surgeon:              Midge Miniumarren Wohl, MD;   [facility];  Service: Endoscopy;  Laterality: N/A;     Reproductive/Obstetrics negative OB ROS                             Anesthesia Physical Anesthesia Plan  ASA: II  Anesthesia Plan: General   Post-op Pain Management:    Induction:   Airway Management Planned:   Additional Equipment:   Intra-op Plan:   Post-operative Plan:   Informed Consent: I have reviewed the patients History and Physical, chart, labs and discussed the procedure including the risks, benefits and alternatives for the proposed anesthesia with the patient or authorized representative who has indicated his/her understanding and acceptance.   Dental Advisory Given  Plan Discussed with: Anesthesiologist, CRNA and Surgeon  Anesthesia Plan Comments:         Anesthesia Quick Evaluation

## 2016-06-09 NOTE — Op Note (Signed)
   Pre-operative Diagnosis: Third-degree internal hemorrhoids  Post-operative Diagnosis: Same  Surgeon: Ricarda Frameharles Talisha Erby   Assistants: None  Anesthesia: General endotracheal anesthesia  ASA Class: 2  Surgeon: Ricarda Frameharles Jabes Primo, MD FACS  Anesthesia: Gen. with endotracheal tube  Assistant: None  Procedure Details  The patient was seen again in the Holding Room. The benefits, complications, treatment options, and expected outcomes were discussed with the patient. The risks of bleeding, infection, recurrence of symptoms, failure to resolve symptoms,  bowel injury, any of which could require further surgery were reviewed with the patient.   The patient was taken to Operating Room, identified as Garrett Barajas and the procedure verified.  A Time Out was held and the above information confirmed.  Prior to the induction of general anesthesia, antibiotic prophylaxis was administered. VTE prophylaxis was in place. General endotracheal anesthesia was then administered and tolerated well. After the induction, the perineum was prepped with Betadine and draped in the sterile fashion. The patient was positioned in the high lithotomy position.  The procedure began with a digital rectal exam. This revealed 3 column internal hemorrhoidal disease as well as distention of external hemorrhoids without obvious thrombosis. Procedure then went onto a speculum exam which showed that the right anterior and posterior columns were much larger and more inflamed than the left. At this time 10 mL of a broken were injected in a perianal block in all directions.  The right posterior column was grasped with an Allis forceps and the interior most aspect of the hemorrhoid column was suture ligated with a 3-0 chromic suture. Using a Harmonic scalpel device the hemorrhoid column was excised in toto and passed off field specimen. Care was taken to stay proximal and away from the sphincters. The resulting mucosal defect was then  closed with the previously placed chromic suture in a running fashion. Attention was then turned to the right anterior column in the same procedure was followed. The internal most aspect was suture ligated with 3-0 chromic suture, the hemorrhoid column was excised with Harmonic scalpel, and the resulting defect was again closed with a running suture.  The left colon was again visualized and determined to be left to a later date if it did not respond to this therapy. A piece of Gelfoam was brought to the field and placed into the rectum for distal hemostasis. The remaining asked for a local was injected as a local block in all directions. Patient tolerated procedure well. All counts were correct at the end of the procedure. A sterile dressing of ABD pads held in place with mesh underwear was then placed. He was awoken from general endotracheal anesthesia and transferred to the PACU in good condition.  Findings: 3 column grade 3 internal hemorrhoids   Estimated Blood Loss: 20 mL         Drains: None         Specimens: Right anterior and posterior hemorrhoid columns          Complications: None                  Condition: Good   Ricarda Frameharles Jya Hughston, MD, FACS

## 2016-06-09 NOTE — H&P (View-Only) (Signed)
Outpatient Surgical Follow Up  05/29/2016  Garrett Barajas is an 50 y.o. male.   Chief Complaint  Patient presents with  . Follow-up    Hemorrhoid    HPI: [age] year old male well-known the surgery service returns to clinic for evaluation of his hemorrhoids. Patient is a TEFL teacherJehovah's Witness and was previously scheduled for hemorrhoidectomy was found to be profoundly anemic at that time. Since that time he has been evaluated by hematology and started on iron with a robust response. His most recent blood count was in the normal range. Patient reports that being on the iron he has work extra hard to maintain regularity and has had a symptomatic worsening of his hemorrhoids. Primarily on the patient's right he notices that the area protrude more and after every bowel movement he has to reduce them himself. Minimal bleeding with wiping currently. He denies any fevers, chills, nausea, vomiting, diarrhea, aspiration, chest pain, short of breath. He is very interested in having these hemorrhoids removed.  Past Medical History  Diagnosis Date  . External hemorrhoids without mention of complication   . Undiagnosed cardiac murmurs   . Dizziness   . Iron deficiency anemia     a. Hgb 6.0 on 05/18/14- started on ferrous fumarate, patient is a Jehovah's Witness  . Refusal of blood transfusions as patient is Jehovah's Witness     Past Surgical History  Procedure Laterality Date  . Colonoscopy    . Adenoidectomy    . Tonsillectomy    . Eye growth removal    . Colonoscopy with propofol N/A 04/10/2016    Procedure: COLONOSCOPY WITH PROPOFOL;  Surgeon: Midge Miniumarren Wohl, MD;  [facility];  Service: Endoscopy;  Laterality: N/A;    Family History  Problem Relation Age of Onset  . Diabetes Mother   . Hypertension Mother   . Diabetes Father   . Hypertension Father   . Diabetes Maternal Grandmother     Social History:  reports that he has never smoked. He has never used smokeless tobacco. He  reports that he drinks about 0.6 oz of alcohol per week. He reports that he does not use illicit drugs.  Allergies: No Known Allergies  Medications reviewed.    ROS A multipoint review of systems was completed. All pertinent positives and negatives are documented within the history of present illness remainder are negative.   BP 133/82 mmHg  Pulse 65  Temp(Src) 97.8 F (36.6 C) (Oral)  Wt 82.555 kg (182 lb)  Physical Exam  Gen.: No acute distress Chest: Clear to auscultation Heart: Regular rate and rhythm Abdomen: Soft and nontender Rectum: Rectal exam reveals 3 column hemorrhoidal disease. On the patient's right there is a visible grade 3 right posterior hemorrhoid column. The left side appears to be grade 2. No active bleeding and normal sphincter tone.   No results found for this or any previous visit (from the past 48 hour(s)). No results found.  Assessment/Plan:  1. Prolapsed internal hemorrhoids, grade 223 [id number] year old male with symptomatic grade 3 internal hemorrhoids. Had a long conversation with patient about the surgery for an open hemorrhoidectomy to include the risks, benefits, alternatives. Discussed at length the primary risk from the surgery as prolonged pain and bleeding. Patient voiced understanding and wishes to proceed. We will plan for an open hemorrhoidectomy on Monday, June 26.     Garrett Frameharles Paisli Silfies, MD FACS General Surgeon  05/29/2016,10:55 AM

## 2016-06-09 NOTE — Anesthesia Postprocedure Evaluation (Signed)
Anesthesia Post Note  Patient: Garrett Barajas  Procedure(s) Performed: Procedure(s) (LRB): HEMORRHOIDECTOMY (N/A)  Patient location during evaluation: PACU Anesthesia Type: General Pain management: satisfactory to patient Vital Signs Assessment: post-procedure vital signs reviewed and stable Cardiovascular status: stable Anesthetic complications: no    Last Vitals:  Filed Vitals:   06/09/16 1325  BP: 123/83  Pulse: 61  Temp: 36.2 C  Resp: 10    Last Pain: There were no vitals filed for this visit.               VAN STAVEREN,Aldean Suddeth

## 2016-06-09 NOTE — Transfer of Care (Signed)
Immediate Anesthesia Transfer of Care Note  Patient: Garrett Barajas  Procedure(s) Performed: Procedure(s): HEMORRHOIDECTOMY (N/A)  Patient Location: PACU  Anesthesia Type:General  Level of Consciousness: sedated  Airway & Oxygen Therapy: Patient Spontanous Breathing and Patient connected to face mask oxygen  Post-op Assessment: Report given to RN and Post -op Vital signs reviewed and stable  Post vital signs: Reviewed and stable  Last Vitals:  Filed Vitals:   06/09/16 1325  BP: 123/83  Pulse: 61  Resp: 10    Last Pain: There were no vitals filed for this visit.       Complications: No apparent anesthesia complications

## 2016-06-09 NOTE — Discharge Instructions (Signed)
Surgical Procedures for Hemorrhoids, Care After °Refer to this sheet in the next few weeks. These instructions provide you with information about caring for yourself after your procedure. Your health care provider may also give you more specific instructions. Your treatment has been planned according to current medical practices, but problems sometimes occur. Call your health care provider if you have any problems or questions after your procedure. °WHAT TO EXPECT AFTER THE PROCEDURE °After your procedure, it is common to have: °· Rectal pain. °· Pain when you are having a bowel movement. °· Slight rectal bleeding. °HOME CARE INSTRUCTIONS °Medicines °· Take over-the-counter and prescription medicines only as told by your health care provider. °· Do not drive or operate heavy machinery while taking prescription pain medicine. °· Use a stool softener or a bulk laxative as told by your health care provider. °Activities °· Rest at home. Return to your normal activities as told by your health care provider. °· Do not lift anything that is heavier than 10 lb (4.5 kg). °· Do not sit for long periods of time. Take a walk every day or as told by your health care provider. °· Do not strain to have a bowel movement. Do not spend a long time sitting on the toilet. °Eating and Drinking °· Eat foods that contain fiber, such as whole grains, beans, nuts, fruits, and vegetables. °· Drink enough fluid to keep your urine clear or pale yellow. °General Instructions °· Sit in a warm bath 2-3 times per day to relieve soreness or itching. °· Keep all follow-up visits as told by your health care provider. This is important. °SEEK MEDICAL CARE IF: °· Your pain medicine is not helping. °· You have a fever or chills. °· You become constipated. °· You have trouble passing urine. °SEEK IMMEDIATE MEDICAL CARE IF: °· You have very bad rectal pain. °· You have heavy bleeding from your rectum. °  °This information is not intended to replace advice  given to you by your health care provider. Make sure you discuss any questions you have with your health care provider. °  °Document Released: 02/21/2004 Document Revised: 08/22/2015 Document Reviewed: 02/26/2015 °Elsevier Interactive Patient Education ©2016 [employer] ° ° °AMBULATORY SURGERY  °DISCHARGE INSTRUCTIONS ° ° °1) The drugs that you were given will stay in your system until tomorrow so for the next 24 hours you should not: ° °A) Drive an automobile °B) Make any legal decisions °C) Drink any alcoholic beverage ° ° °2) You may resume regular meals tomorrow.  Today it is better to start with liquids and gradually work up to solid foods. ° °You may eat anything you prefer, but it is better to start with liquids, then soup and crackers, and gradually work up to solid foods. ° ° °3) Please notify your doctor immediately if you have any unusual bleeding, trouble breathing, redness and pain at the surgery site, drainage, fever, or pain not relieved by medication. ° ° ° °4) Additional Instructions: ° ° ° ° ° ° ° °Please contact your physician with any problems or Same Day Surgery at [phone], Monday through Friday 6 am to 4 pm, or [facility] number at [phone]. °

## 2016-06-09 NOTE — Interval H&P Note (Signed)
History and Physical Interval Note:  06/09/2016 12:08 PM  Garrett Barajas  has presented today for surgery, with the diagnosis of PROLAPSED INTERNAL HEMORRHOIDS  The various methods of treatment have been discussed with the patient and family. After consideration of risks, benefits and other options for treatment, the patient has consented to  Procedure(s): HEMORRHOIDECTOMY (N/A) as a surgical intervention .  The patient's history has been reviewed, patient examined, no change in status, stable for surgery.  I have reviewed the patient's chart and labs.  Questions were answered to the patient's satisfaction.     Ricarda Frameharles Maddix Heinz

## 2016-06-10 ENCOUNTER — Telehealth: Payer: Self-pay | Admitting: General Surgery

## 2016-06-10 NOTE — Telephone Encounter (Signed)
  Spoke with patients wife and read the below to her.     Post-discharge call made to patient. Pain under control at this time. Patient does have concerns regarding constipation. Informed patient that they should increase water intake and activity level as much as possible to help with bowel movement. Also educated that they may use Miralax over the counter x 2 doses, 6 hours apart, followed by Dulcolax x 2 doses, 6 hours apart until they have a successful bowel movement. Asked patient to call back for further instructions if after taking both doses of these medications, they are still unsuccessful with a bowel movement.  Patient verbalizes understanding of this information.  Denies any other questions or concerns.

## 2016-06-10 NOTE — Telephone Encounter (Signed)
Patient called to say he ok going pee but is constipated and doesn't want to force himself to go. What can he do? Please call.

## 2016-06-11 ENCOUNTER — Telehealth: Payer: Self-pay | Admitting: General Surgery

## 2016-06-11 ENCOUNTER — Other Ambulatory Visit
Admission: RE | Admit: 2016-06-11 | Discharge: 2016-06-11 | Disposition: A | Discharge status: Home / Self Care | Payer: BLUE CROSS/BLUE SHIELD | Source: Ambulatory Visit | Attending: General Surgery | Admitting: General Surgery

## 2016-06-11 DIAGNOSIS — R300 Dysuria: Secondary | ICD-10-CM | POA: Diagnosis present

## 2016-06-11 LAB — URINALYSIS COMPLETE WITH MICROSCOPIC (ARMC ONLY)
BILIRUBIN URINE: NEGATIVE
GLUCOSE, UA: NEGATIVE mg/dL
KETONES UR: NEGATIVE mg/dL
LEUKOCYTES UA: NEGATIVE
NITRITE: NEGATIVE
Protein, ur: NEGATIVE mg/dL
SPECIFIC GRAVITY, URINE: 1.01 (ref 1.005–1.030)
pH: 5.5 (ref 5.0–8.0)

## 2016-06-11 LAB — SURGICAL PATHOLOGY

## 2016-06-11 MED ORDER — SULFAMETHOXAZOLE-TRIMETHOPRIM 800-160 MG PO TABS: 1.0000 | ORAL_TABLET | Freq: Two times a day (BID) | ORAL | Status: DC

## 2016-06-11 NOTE — Telephone Encounter (Signed)
Reviewed results and symptoms with Dr. Orvis BrillLoflin. Patient was placed on Bactrim DS x 10 days. [facility]  Called patient reviewed results and medication information.  Encouraged to call back with any fever >101, abdominal pain, increased pain at incision site, increased redness or drainage. Patient's wife verbalizes understanding.

## 2016-06-11 NOTE — Telephone Encounter (Signed)
Patient had HEMORRHOIDECTOMY on 6/26 with Dr Tonita CongWoodham. Hasn't had a bowel movement since surgery. He spoke with the nurse yesterday and was instructed to start taking Dulcolax. He started the Dulcolax yesterday in addition to his fiber supplement. He still hasn't had a bowel movement and he is now having chills and a lot of pressure. Patient and his wife, Alric SetonLina, would like to speak with the nurse again. Please call and advise. Thanks.

## 2016-06-11 NOTE — Telephone Encounter (Addendum)
Patient has had a bowel movement since call has been made and feels better. But, he is urinating every 30 minutes, small amounts and has discomfort when he voids. Explained that we will need to get a UA and UCS on patient as soon as possible to see if patient has a urinary tract infection.  UA and UCS ordered at this time.  Will await results and speak with surgeon when results are in.

## 2016-06-12 LAB — URINE CULTURE: CULTURE: NO GROWTH

## 2016-06-18 ENCOUNTER — Ambulatory Visit (INDEPENDENT_AMBULATORY_CARE_PROVIDER_SITE_OTHER): Payer: BLUE CROSS/BLUE SHIELD | Admitting: Surgery

## 2016-06-18 ENCOUNTER — Encounter: Payer: Self-pay | Admitting: Surgery

## 2016-06-18 VITALS — BP 158/100 | HR 82 | Temp 98.5°F | Ht 69.0 in | Wt 176.0 lb

## 2016-06-18 DIAGNOSIS — Z09 Encounter for follow-up examination after completed treatment for conditions other than malignant neoplasm: Secondary | ICD-10-CM

## 2016-06-18 NOTE — Patient Instructions (Signed)
Please see appointment listed below. Please call our office if you have any questions or concerns. 

## 2016-06-18 NOTE — Progress Notes (Signed)
S/p hemorrhoidectomy by Dr. Tonita CongWoodham, significant edema He is doing ok as expected Some pain  Doing sitz baths  PE NAD There is circumferential prolapsed internal hemorrhoid tender to palpation, left lateral column w hemorrhoidectomy site, no evidence of perineal sepsis  A/p D/W operating surgeons, Dr. Tonita CongWoodham mentions that at the time of surgery he had a significant edema component so it is not unexpected to have  Current findings. HE is to F/U W him next week, the pt should continue sitz, baths, ice packs and fiber

## 2016-06-20 ENCOUNTER — Encounter: Payer: BLUE CROSS/BLUE SHIELD | Admitting: General Surgery

## 2016-06-30 ENCOUNTER — Encounter: Payer: Self-pay | Admitting: General Surgery

## 2016-06-30 ENCOUNTER — Ambulatory Visit (INDEPENDENT_AMBULATORY_CARE_PROVIDER_SITE_OTHER): Payer: BLUE CROSS/BLUE SHIELD | Admitting: General Surgery

## 2016-06-30 VITALS — BP 150/95 | HR 74 | Temp 98.2°F | Wt 181.0 lb

## 2016-06-30 DIAGNOSIS — Z4889 Encounter for other specified surgical aftercare: Secondary | ICD-10-CM

## 2016-06-30 DIAGNOSIS — K644 Residual hemorrhoidal skin tags: Secondary | ICD-10-CM

## 2016-06-30 DIAGNOSIS — K648 Other hemorrhoids: Secondary | ICD-10-CM

## 2016-06-30 NOTE — Progress Notes (Signed)
Outpatient Surgical Follow Up  06/30/2016  Garrett Barajas is an 50 y.o. male.   Chief Complaint  Patient presents with  . Routine Post Op    Hemorrhoidectomy 06/09/2016 Dr. Tonita Cong    HPI: 50 year old male returns to clinic for follow-up 3 weeks status post 2 column internal hemorrhoidectomy. Patient reports that the initial pain and bleeding subsided within the first 1-2 weeks. However, he states that the third column of hemorrhoids has swollen up much more since that time and has caused a constant discomfort making it difficult to defecate or sit down. The side that was operated on he states is much improved. He denies any fevers, chills, nausea, vomiting, chest pain, shortness of breath, diarrhea, constipation. He is drinking plenty of water and maintaining regularity.  Past Medical History  Diagnosis Date  . External hemorrhoids without mention of complication   . Dizziness   . Iron deficiency anemia     a. Hgb 6.0 on 05/18/14- started on ferrous fumarate, patient is a [employer]  . Refusal of blood transfusions as patient is Jehovah's Witness   . Undiagnosed cardiac murmurs     HAD W/U DONE (EKG, ECHO) ALL NORMAL     Past Surgical History  Procedure Laterality Date  . Colonoscopy    . Adenoidectomy    . Tonsillectomy    . Eye growth removal    . Colonoscopy with propofol N/A 04/10/2016    Procedure: COLONOSCOPY WITH PROPOFOL;  Surgeon: Midge Minium, MD;  Location: [facility];  Service: Endoscopy;  Laterality: N/A;  . Hemorrhoid surgery N/A 06/09/2016    Procedure: HEMORRHOIDECTOMY;  Surgeon: Ricarda Frame, MD;  Location: [facility];  Service: General;  Laterality: N/A;    Family History  Problem Relation Age of Onset  . Diabetes Mother   . Hypertension Mother   . Diabetes Father   . Hypertension Father   . Diabetes Maternal Grandmother     Social History:  reports that he has never smoked. He has never used smokeless tobacco. He reports that he drinks about  0.6 oz of alcohol per week. He reports that he does not use illicit drugs.  Allergies: No Known Allergies  Medications reviewed.    ROS  A multipoint review of systems was completed. All pertinent positives and negatives are documented within the history of present illness and remainder are negative.  BP 150/95 mmHg  Pulse 74  Temp(Src) 98.2 F (36.8 C) (Oral)  Wt 82.101 kg (181 lb)  Physical Exam Gen.: No acute distress Chest: Clear to auscultation Heart: Regular rhythm Abdomen: Soft and nontender Rectum: Evidence of prior hemorrhoidectomy on the left anterior and posterior columns without active bleeding. Small external hemorrhoids on the side. Right-sided disease with large volume external hemorrhoid that has to be pushed out of the way for rectal exam.    No results found for this or any previous visit (from the past 48 hour(s)). No results found.  Assessment/Plan:  1. External hemorrhoids Patient with worsened external hemorrhoidal disease after 2 column internal hemorrhoidectomy. Discussed with the patient the importance of waiting an appropriate period of time prior to having a repeat rectal surgery to allow for swelling to resolve and the healing process to occur for the suture lines. With that being said patient is very interested in having his external hemorrhoid.with on the right side. The procedure of an external hemorrhoidectomy was discussed in detail to include the risks of bleeding, infection, stricture, incontinence. Patient voiced understanding and still wishes to  proceed. We will plan for a rectal exam under anesthesia with an open hemorrhoidectomy the remaining side in August.  2. Aftercare following surgery Patient is recovering well from a 2 column open hemorrhoidectomy. No evidence of bleeding. Maintain regularity. All questions answered to his satisfaction and pathology provided for the patient.     Ricarda Frameharles Marlee Trentman, MD FACS General  Surgeon  06/30/2016,11:14 AM

## 2016-06-30 NOTE — Patient Instructions (Signed)
Please give us a call if you have any questions or concerns.   Please look at your blue sheet for reference.

## 2016-07-01 ENCOUNTER — Telehealth: Payer: Self-pay | Admitting: General Surgery

## 2016-07-01 NOTE — Telephone Encounter (Signed)
Left message for patient to call back to obtain information below.   Pt advised of pre op date/time and sx date. Sx: 07/15/16 with Dr Simonne ComeWoodham--Rectal Exam Under Anesthesia-External Hemorrhoidectomy.  Pre op: 07/07/16 between 9-1:00pm--Phone.   Patient made aware to call [phone], between 1-3:00pm the day before surgery, to find out what time to arrive.

## 2016-07-02 NOTE — Telephone Encounter (Signed)
Patient called back and I gave him the information provided below. He did not have additional questions.

## 2016-07-07 ENCOUNTER — Encounter: Payer: Self-pay | Admitting: *Deleted

## 2016-07-07 ENCOUNTER — Encounter
Admission: RE | Admit: 2016-07-07 | Discharge: 2016-07-07 | Disposition: A | Discharge status: Home / Self Care | Payer: BLUE CROSS/BLUE SHIELD | Source: Ambulatory Visit | Attending: General Surgery | Admitting: General Surgery

## 2016-07-07 NOTE — Patient Instructions (Signed)
  Your procedure is scheduled on: 07-15-16 Report to Same Day Surgery 2nd floor medical mall To find out your arrival time please call [phone] between 1PM - 3PM on 07-14-16  Remember: Instructions that are not followed completely may result in serious medical risk, up to and including death, or upon the discretion of your surgeon and anesthesiologist your surgery may need to be rescheduled.    _x___ 1. Do not eat food or drink liquids after midnight. No gum chewing or hard candies.     __x__ 2. No Alcohol for 24 hours before or after surgery.   __x__3. No Smoking for 24 prior to surgery.   ____  4. Bring all medications with you on the day of surgery if instructed.    __x__ 5. Notify your doctor if there is any change in your medical condition     (cold, fever, infections).     Do not wear jewelry, make-up, hairpins, clips or nail polish.  Do not wear lotions, powders, or perfumes. You may wear deodorant.  Do not shave 48 hours prior to surgery. Men may shave face and neck.  Do not bring valuables to the hospital.    [facility] is not responsible for any belongings or valuables.               Contacts, dentures or bridgework may not be worn into surgery.  Leave your suitcase in the car. After surgery it may be brought to your room.  For patients admitted to the hospital, discharge time is determined by your treatment team.   Patients discharged the day of surgery will not be allowed to drive home.    Please read over the following fact sheets that you were given:   [facility] Preparing for Surgery and or MRSA Information   ____ Take these medicines the morning of surgery with A SIP OF WATER:    1. NONE  2.  3.  4.  5.  6.  _X___ Fleet Enema (as directed)   ____ Use CHG Soap or sage wipes as directed on instruction sheet   ____ Use inhalers on the day of surgery and bring to hospital day of surgery  ____ Stop metformin 2 days prior to surgery    ____ Take 1/2 of  usual insulin dose the night before surgery and none on the morning of           surgery.   ____ Stop aspirin or coumadin, or plavix  _x__ Stop Anti-inflammatories such as Advil, Aleve, Ibuprofen, Motrin, Naproxen,          Naprosyn, Goodies powders or aspirin products. Ok to take Tylenol.   ____ Stop supplements until after surgery.    ____ Bring C-Pap to the hospital.

## 2016-07-15 ENCOUNTER — Ambulatory Visit: Payer: BLUE CROSS/BLUE SHIELD | Admitting: Anesthesiology

## 2016-07-15 ENCOUNTER — Encounter: Payer: Self-pay | Admitting: *Deleted

## 2016-07-15 ENCOUNTER — Ambulatory Visit
Admission: RE | Admit: 2016-07-15 | Discharge: 2016-07-15 | Disposition: A | Discharge status: Home / Self Care | Payer: BLUE CROSS/BLUE SHIELD | Source: Ambulatory Visit | Attending: General Surgery | Admitting: General Surgery

## 2016-07-15 ENCOUNTER — Encounter
Admission: RE | Disposition: A | Discharge status: Home / Self Care | Payer: Self-pay | Source: Ambulatory Visit | Attending: General Surgery

## 2016-07-15 DIAGNOSIS — K648 Other hemorrhoids: Secondary | ICD-10-CM | POA: Diagnosis not present

## 2016-07-15 DIAGNOSIS — K644 Residual hemorrhoidal skin tags: Secondary | ICD-10-CM | POA: Diagnosis not present

## 2016-07-15 DIAGNOSIS — Z833 Family history of diabetes mellitus: Secondary | ICD-10-CM | POA: Insufficient documentation

## 2016-07-15 DIAGNOSIS — Z8249 Family history of ischemic heart disease and other diseases of the circulatory system: Secondary | ICD-10-CM | POA: Insufficient documentation

## 2016-07-15 DIAGNOSIS — D509 Iron deficiency anemia, unspecified: Secondary | ICD-10-CM | POA: Insufficient documentation

## 2016-07-15 DIAGNOSIS — R011 Cardiac murmur, unspecified: Secondary | ICD-10-CM | POA: Insufficient documentation

## 2016-07-15 HISTORY — PX: RECTAL EXAM UNDER ANESTHESIA: SHX6399

## 2016-07-15 HISTORY — PX: HEMORRHOID SURGERY: SHX153

## 2016-07-15 SURGERY — EXAM UNDER ANESTHESIA, RECTUM
Anesthesia: General

## 2016-07-15 MED ORDER — BUPIVACAINE LIPOSOME 1.3 % IJ SUSP
INTRAMUSCULAR | Status: AC
  Filled 2016-07-15: qty 20

## 2016-07-15 MED ORDER — FENTANYL CITRATE (PF) 100 MCG/2ML IJ SOLN
INTRAMUSCULAR | Status: DC | PRN
  Administered 2016-07-15 (×2): 25 ug via INTRAVENOUS
  Administered 2016-07-15: 50 ug via INTRAVENOUS

## 2016-07-15 MED ORDER — PROPOFOL 10 MG/ML IV BOLUS
INTRAVENOUS | Status: DC | PRN
  Administered 2016-07-15: 40 mg via INTRAVENOUS
  Administered 2016-07-15: 160 mg via INTRAVENOUS

## 2016-07-15 MED ORDER — ACETAMINOPHEN 10 MG/ML IV SOLN
INTRAVENOUS | Status: AC
  Filled 2016-07-15: qty 100

## 2016-07-15 MED ORDER — LIDOCAINE HCL (CARDIAC) 20 MG/ML IV SOLN
INTRAVENOUS | Status: DC | PRN
  Administered 2016-07-15: 80 mg via INTRAVENOUS

## 2016-07-15 MED ORDER — ONDANSETRON HCL 4 MG/2ML IJ SOLN
INTRAMUSCULAR | Status: DC | PRN
  Administered 2016-07-15: 4 mg via INTRAVENOUS

## 2016-07-15 MED ORDER — METHYLENE BLUE 0.5 % INJ SOLN
INTRAVENOUS | Status: AC
  Filled 2016-07-15: qty 10

## 2016-07-15 MED ORDER — CEFAZOLIN SODIUM-DEXTROSE 2-4 GM/100ML-% IV SOLN
2.0000 g | INTRAVENOUS | Status: AC
  Administered 2016-07-15: 2 g via INTRAVENOUS

## 2016-07-15 MED ORDER — SODIUM CHLORIDE 0.9 % IJ SOLN
INTRAMUSCULAR | Status: AC
  Filled 2016-07-15: qty 50

## 2016-07-15 MED ORDER — SODIUM CHLORIDE 0.9 % IV SOLN
INTRAVENOUS | Status: DC | PRN
  Administered 2016-07-15 (×2): 10 mL

## 2016-07-15 MED ORDER — GELATIN ABSORBABLE 12-7 MM EX MISC
CUTANEOUS | Status: DC | PRN
  Administered 2016-07-15: 1

## 2016-07-15 MED ORDER — FENTANYL CITRATE (PF) 100 MCG/2ML IJ SOLN
25.0000 ug | INTRAMUSCULAR | Status: DC | PRN
Start: 2016-07-15 — End: 2016-07-15

## 2016-07-15 MED ORDER — CHLORHEXIDINE GLUCONATE CLOTH 2 % EX PADS: 6.0000 | MEDICATED_PAD | Freq: Once | CUTANEOUS | Status: DC

## 2016-07-15 MED ORDER — FAMOTIDINE 20 MG PO TABS
20.0000 mg | ORAL_TABLET | Freq: Once | ORAL | Status: AC
  Administered 2016-07-15: 20 mg via ORAL

## 2016-07-15 MED ORDER — MIDAZOLAM HCL 2 MG/2ML IJ SOLN
INTRAMUSCULAR | Status: DC | PRN
  Administered 2016-07-15: 2 mg via INTRAVENOUS

## 2016-07-15 MED ORDER — LACTATED RINGERS IV SOLN
INTRAVENOUS | Status: DC
  Administered 2016-07-15: 08:00:00 via INTRAVENOUS

## 2016-07-15 MED ORDER — FLEET ENEMA 7-19 GM/118ML RE ENEM: 1.0000 | ENEMA | Freq: Once | RECTAL | Status: DC

## 2016-07-15 MED ORDER — OXYCODONE-ACETAMINOPHEN 5-325 MG PO TABS: 1.0000 | ORAL_TABLET | ORAL | 0 refills | Status: DC | PRN

## 2016-07-15 MED ORDER — GELATIN ABSORBABLE 100 CM EX MISC
CUTANEOUS | Status: AC
  Filled 2016-07-15: qty 1

## 2016-07-15 MED ORDER — ACETAMINOPHEN 10 MG/ML IV SOLN
INTRAVENOUS | Status: DC | PRN
  Administered 2016-07-15: 1000 mg via INTRAVENOUS

## 2016-07-15 MED ORDER — FAMOTIDINE 20 MG PO TABS
ORAL_TABLET | ORAL | Status: AC
  Administered 2016-07-15: 20 mg via ORAL
  Filled 2016-07-15: qty 1

## 2016-07-15 MED ORDER — CEFAZOLIN SODIUM-DEXTROSE 2-4 GM/100ML-% IV SOLN
INTRAVENOUS | Status: AC
  Filled 2016-07-15: qty 100

## 2016-07-15 MED ORDER — GLYCOPYRROLATE 0.2 MG/ML IJ SOLN
INTRAMUSCULAR | Status: DC | PRN
  Administered 2016-07-15: 0.2 mg via INTRAVENOUS

## 2016-07-15 MED ORDER — DOCUSATE SODIUM 100 MG PO CAPS: 100.0000 mg | ORAL_CAPSULE | Freq: Two times a day (BID) | ORAL | 0 refills | Status: DC

## 2016-07-15 MED ORDER — BUPIVACAINE HCL (PF) 0.25 % IJ SOLN
INTRAMUSCULAR | Status: AC
  Filled 2016-07-15: qty 30

## 2016-07-15 MED ORDER — CHLORHEXIDINE GLUCONATE CLOTH 2 % EX PADS
6.0000 | MEDICATED_PAD | Freq: Once | CUTANEOUS | Status: AC
  Administered 2016-07-15: 6 via TOPICAL

## 2016-07-15 MED ORDER — ONDANSETRON HCL 4 MG/2ML IJ SOLN: 4.0000 mg | Freq: Once | INTRAMUSCULAR | Status: DC | PRN

## 2016-07-15 MED ORDER — DEXAMETHASONE SODIUM PHOSPHATE 10 MG/ML IJ SOLN
INTRAMUSCULAR | Status: DC | PRN
  Administered 2016-07-15: 5 mg via INTRAVENOUS

## 2016-07-15 SURGICAL SUPPLY — 37 items
BLADE SURG 15 STRL LF DISP TIS (BLADE) ×1 IMPLANT
BLADE SURG 15 STRL SS (BLADE) ×1
BRIEF STRETCH MATERNITY 2XLG (MISCELLANEOUS) ×2 IMPLANT
CANISTER SUCT 1200ML W/VALVE (MISCELLANEOUS) ×2 IMPLANT
CORD SCALPEL HARMONIC (MISCELLANEOUS) ×1 IMPLANT
CUP MEDICINE 2OZ PLAST GRAD ST (MISCELLANEOUS) IMPLANT
DRAPE LAPAROTOMY 100X77 ABD (DRAPES) ×2 IMPLANT
DRAPE LEGGINS SURG 28X43 STRL (DRAPES) ×2 IMPLANT
DRAPE UNDER BUTTOCK W/FLU (DRAPES) ×2 IMPLANT
ELECT CAUTERY BLADE 6.4 (BLADE) ×2 IMPLANT
ELECT CAUTERY BLADE TIP 2.5 (TIP) ×2
ELECT REM PT RETURN 9FT ADLT (ELECTROSURGICAL) ×2
ELECTRODE CAUTERY BLDE TIP 2.5 (TIP) ×1 IMPLANT
ELECTRODE REM PT RTRN 9FT ADLT (ELECTROSURGICAL) ×1 IMPLANT
GLOVE BIO SURGEON STRL SZ7.5 (GLOVE) ×2 IMPLANT
GLOVE INDICATOR 8.0 STRL GRN (GLOVE) ×2 IMPLANT
GOWN STRL REUS W/ TWL LRG LVL3 (GOWN DISPOSABLE) ×2 IMPLANT
GOWN STRL REUS W/TWL LRG LVL3 (GOWN DISPOSABLE) ×2
HARMONIC SCALPEL CORD (MISCELLANEOUS) ×2
HARMONIC SCALPEL FOCUS (MISCELLANEOUS) ×2 IMPLANT
KIT RM TURNOVER CYSTO AR (KITS) ×2 IMPLANT
KIT RM TURNOVER STRD PROC AR (KITS) ×2 IMPLANT
LABEL OR SOLS (LABEL) IMPLANT
NEEDLE HYPO 25X1 1.5 SAFETY (NEEDLE) ×2 IMPLANT
NS IRRIG 500ML POUR BTL (IV SOLUTION) ×2 IMPLANT
PACK BASIN MINOR ARMC (MISCELLANEOUS) ×2 IMPLANT
PAD ABD DERMACEA PRESS 5X9 (GAUZE/BANDAGES/DRESSINGS) ×2 IMPLANT
PAD OB MATERNITY 4.3X12.25 (PERSONAL CARE ITEMS) IMPLANT
PAD PREP 24X41 OB/GYN DISP (PERSONAL CARE ITEMS) ×2 IMPLANT
SOL PREP PVP 2OZ (MISCELLANEOUS) ×2
SOLUTION PREP PVP 2OZ (MISCELLANEOUS) ×1 IMPLANT
SPONGE XRAY 4X4 16PLY STRL (MISCELLANEOUS) ×2 IMPLANT
SURGILUBE 2OZ TUBE FLIPTOP (MISCELLANEOUS) ×2 IMPLANT
SUT CHROMIC 3 0 SH 27 (SUTURE) ×4 IMPLANT
SYR 50ML LL SCALE MARK (SYRINGE) ×2 IMPLANT
SYR BULB EAR ULCER 3OZ GRN STR (SYRINGE) ×2 IMPLANT
SYRINGE 10CC LL (SYRINGE) ×2 IMPLANT

## 2016-07-15 NOTE — Op Note (Signed)
   Pre-operative Diagnosis: Internal and external hemorrhoids  Post-operative Diagnosis: Same  Surgeon: Garrett Barajas   Assistants: None  Anesthesia: General endotracheal anesthesia  ASA Class: 2  Surgeon: Garrett Frame, MD FACS  Anesthesia: Gen. with endotracheal tube  Assistant: None  Procedure Details  The patient was seen again in the Holding Room. The benefits, complications, treatment options, and expected outcomes were discussed with the patient. The risks of bleeding, infection, recurrence of symptoms, failure to resolve symptoms,  bowel or sphincter injury, any of which could require further surgery were reviewed with the patient.   The patient was taken to Operating Room, identified as Garrett Barajas and the procedure verified.  A Time Out was held and the above information confirmed.  Prior to the induction of general anesthesia, antibiotic prophylaxis was administered. VTE prophylaxis was in place. General endotracheal anesthesia was then administered and tolerated well. After the induction, the rectum was prepped with Betadine and draped in the sterile fashion. The patient was positioned in the high lithotomy position.  The procedure began with a digital rectal exam. Patient is noted to have extensive residual external hemorrhoidal disease on his right anterior posterior columns. The previous internal hemorrhoidectomy suture lines were visualized on his right with no residual internal hemorrhoids on the right. He had a combination of internal and external hemorrhoids on his left. A rectal block was performed with exparel.   We then first turned our attention to the external hemorrhoids on his right. Grasping the anterior column with Allis forcep the skin was incised with 15 blade scalpel use of electrocautery was excised in toto. Meticulous hemostasis was insured with direct electrocautery and then the skin edges were reapproximated with interrupted figure-of-eight sutures  with a 3-0 chromic suture.  The posterior column was very much larger than the anterior column. The center of this was grasped with an Allis forceps and the central third of the extra number: Was incised with a 15 blade scalpel and excised with Bovie cautery. Again meticulous hemostasis was insured and the skin edges were also reapproximated with interrupted figure-of-eight 3-0 chromic sutures.  The left complex internal and external hemorrhoids grasped with 2 Allis forceps. The skin edges of the external hemorrhoid were incised with 15 blade scalpel and using the Harmonic Scalpel the external and internal hemorrhoids were removed in toto. Again meticulous hemostasis was insured with electrocautery prior to closure of the mucosa and skin edges with interrupted figure-of-eight sutures of 3-0 chromic.  Procedure of some bleeding at completion of this case however a Gelfoam pad was placed in the rectum for assistance with hemostasis. The rectal block was again re-instilled with the aforementioned local anesthetic. There were no obvious, complications of in the procedure. Patient tolerated procedure well. General endotracheal anesthesia and transferred to the PACU in good condition. All counts were correct at the procedure.  Findings: Complex internal and external hemorrhoid disease.   Estimated Blood Loss: 50 mL         Drains: None         Specimens: Multiple hemorrhoids          Complications: None                  Condition: Good   Garrett Frame, MD, FACS

## 2016-07-15 NOTE — Anesthesia Postprocedure Evaluation (Signed)
Anesthesia Post Note  Patient: Garrett Barajas  Procedure(s) Performed: Procedure(s) (LRB): RECTAL EXAM UNDER ANESTHESIA (N/A) HEMORRHOIDECTOMY (N/A)  Patient location during evaluation: PACU Anesthesia Type: General Level of consciousness: awake and alert Pain management: pain level controlled Vital Signs Assessment: post-procedure vital signs reviewed and stable Respiratory status: spontaneous breathing, nonlabored ventilation, respiratory function stable and patient connected to nasal cannula oxygen Cardiovascular status: blood pressure returned to baseline and stable Postop Assessment: no signs of nausea or vomiting Anesthetic complications: no    Last Vitals:  Vitals:   07/15/16 1115 07/15/16 1152  BP: (!) 140/97 134/84  Pulse: (!) 50 (!) 57  Resp: 14 16  Temp: (!) 34.6 C 36.6 C    Last Pain:  Vitals:   07/15/16 1152  TempSrc: Oral                 Garrett Barajas S

## 2016-07-15 NOTE — H&P (View-Only) (Signed)
Outpatient Surgical Follow Up  06/30/2016  Garrett Barajas is an 50 y.o. male.   Chief Complaint  Patient presents with  . Routine Post Op    Hemorrhoidectomy 06/09/2016 Dr. Tonita Cong    HPI: 50 year old male returns to clinic for follow-up 3 weeks status post 2 column internal hemorrhoidectomy. Patient reports that the initial pain and bleeding subsided within the first 1-2 weeks. However, he states that the third column of hemorrhoids has swollen up much more since that time and has caused a constant discomfort making it difficult to defecate or sit down. The side that was operated on he states is much improved. He denies any fevers, chills, nausea, vomiting, chest pain, shortness of breath, diarrhea, constipation. He is drinking plenty of water and maintaining regularity.  Past Medical History  Diagnosis Date  . External hemorrhoids without mention of complication   . Dizziness   . Iron deficiency anemia     a. Hgb 6.0 on 05/18/14- started on ferrous fumarate, patient is a Jehov[employer]'s Witness  . Refusal of blood transfusions as patient is Jehovah's Witness   . Undiagnosed cardiac murmurs     HAD W/U DONE (EKG, ECHO) ALL NORMAL     Past Surgical History  Procedure Laterality Date  . Colonoscopy    . Adenoidectomy    . Tonsillectomy    . Eye growth removal    . Colonoscopy with propofol N/A 04/10/2016    Procedure: COLONOSCOPY WITH PROPOFOL;  Surgeon: Midge Minium, MD;  Location: [facility];  Service: Endoscopy;  Laterality: N/A;  . Hemorrhoid surgery N/A 06/09/2016    Procedure: HEMORRHOIDECTOMY;  Surgeon: Ricarda Frame, MD;  Location: [facility];  Service: General;  Laterality: N/A;    Family History  Problem Relation Age of Onset  . Diabetes Mother   . Hypertension Mother   . Diabetes Father   . Hypertension Father   . Diabetes Maternal Grandmother     Social History:  reports that he has never smoked. He has never used smokeless tobacco. He reports that he drinks about  0.6 oz of alcohol per week. He reports that he does not use illicit drugs.  Allergies: No Known Allergies  Medications reviewed.    ROS  A multipoint review of systems was completed. All pertinent positives and negatives are documented within the history of present illness and remainder are negative.  BP 150/95 mmHg  Pulse 74  Temp(Src) 98.2 F (36.8 C) (Oral)  Wt 82.101 kg (181 lb)  Physical Exam Gen.: No acute distress Chest: Clear to auscultation Heart: Regular rhythm Abdomen: Soft and nontender Rectum: Evidence of prior hemorrhoidectomy on the left anterior and posterior columns without active bleeding. Small external hemorrhoids on the side. Right-sided disease with large volume external hemorrhoid that has to be pushed out of the way for rectal exam.    No results found for this or any previous visit (from the past 48 hour(s)). No results found.  Assessment/Plan:  1. External hemorrhoids Patient with worsened external hemorrhoidal disease after 2 column internal hemorrhoidectomy. Discussed with the patient the importance of waiting an appropriate period of time prior to having a repeat rectal surgery to allow for swelling to resolve and the healing process to occur for the suture lines. With that being said patient is very interested in having his external hemorrhoid.with on the right side. The procedure of an external hemorrhoidectomy was discussed in detail to include the risks of bleeding, infection, stricture, incontinence. Patient voiced understanding and still wishes to  proceed. We will plan for a rectal exam under anesthesia with an open hemorrhoidectomy the remaining side in August.  2. Aftercare following surgery Patient is recovering well from a 2 column open hemorrhoidectomy. No evidence of bleeding. Maintain regularity. All questions answered to his satisfaction and pathology provided for the patient.     Ricarda Frame, MD FACS General  Surgeon  06/30/2016,11:14 AM

## 2016-07-15 NOTE — Transfer of Care (Signed)
Immediate Anesthesia Transfer of Care Note  Patient: Garrett Barajas  Procedure(s) Performed: Procedure(s): RECTAL EXAM UNDER ANESTHESIA (N/A) HEMORRHOIDECTOMY (N/A)  Patient Location: PACU  Anesthesia Type:General  Level of Consciousness: sedated  Airway & Oxygen Therapy: Patient Spontanous Breathing and Patient connected to face mask oxygen  Post-op Assessment: Post -op Vital signs reviewed and stable  Post vital signs: stable  Last Vitals:  Vitals:   07/15/16 1018 07/15/16 1019  BP: 107/73 107/73  Pulse: (!) 59 (!) 59  Resp: 13 13  Temp: 36.1 C 36.1 C    Last Pain:  Vitals:   07/15/16 1019  TempSrc: Temporal         Complications: No apparent anesthesia complications

## 2016-07-15 NOTE — Anesthesia Procedure Notes (Addendum)
Procedure Name: LMA Insertion Date/Time: 07/15/2016 9:10 AM Performed by: Irving Burton Pre-anesthesia Checklist: Patient identified, Emergency Drugs available, Suction available and Patient being monitored Patient Re-evaluated:Patient Re-evaluated prior to inductionOxygen Delivery Method: Circle system utilized Preoxygenation: Pre-oxygenation with 100% oxygen Intubation Type: IV induction Ventilation: Mask ventilation without difficulty LMA: LMA inserted LMA Size: 4.5 Number of attempts: 1 Placement Confirmation: positive ETCO2 and breath sounds checked- equal and bilateral Tube secured with: Tape Dental Injury: Teeth and Oropharynx as per pre-operative assessment

## 2016-07-15 NOTE — Interval H&P Note (Signed)
History and Physical Interval Note:  07/15/2016 8:44 AM  Garrett Barajas  has presented today for surgery, with the diagnosis of EXTERNAL HEMORRHOIDS  The various methods of treatment have been discussed with the patient and family. After consideration of risks, benefits and other options for treatment, the patient has consented to  Procedure(s): RECTAL EXAM UNDER ANESTHESIA (N/A) HEMORRHOIDECTOMY (N/A) as a surgical intervention .  The patient's history has been reviewed, patient examined, no change in status, stable for surgery.  I have reviewed the patient's chart and labs.  Questions were answered to the patient's satisfaction.     Ricarda Frame

## 2016-07-15 NOTE — Anesthesia Preprocedure Evaluation (Addendum)
Anesthesia Evaluation  Patient identified by MRN, date of birth, ID band Patient awake    Reviewed: Allergy & Precautions, NPO status , Patient's Chart, lab work & pertinent test results, reviewed documented beta blocker date and time   Airway Mallampati: II  TM Distance: >3 FB     Dental  (+) Chipped   Pulmonary           Cardiovascular      Neuro/Psych    GI/Hepatic   Endo/Other    Renal/GU      Musculoskeletal   Abdominal   Peds  Hematology  (+) anemia ,   Anesthesia Other Findings Jehovahs.EKG and ECHO OK. Hb 13.2.  Reproductive/Obstetrics                            Anesthesia Physical Anesthesia Plan  ASA: III  Anesthesia Plan: General   Post-op Pain Management:    Induction: Intravenous  Airway Management Planned: LMA  Additional Equipment:   Intra-op Plan:   Post-operative Plan:   Informed Consent: I have reviewed the patients History and Physical, chart, labs and discussed the procedure including the risks, benefits and alternatives for the proposed anesthesia with the patient or authorized representative who has indicated his/her understanding and acceptance.     Plan Discussed with: CRNA  Anesthesia Plan Comments:         Anesthesia Quick Evaluation

## 2016-07-15 NOTE — Brief Op Note (Signed)
07/15/2016  10:09 AM  PATIENT:  Arsal Niebla  50 y.o. male  PRE-OPERATIVE DIAGNOSIS:  EXTERNAL and internal HEMORRHOIDS  POST-OPERATIVE DIAGNOSIS:  Same  PROCEDURE:  Procedure(s): RECTAL EXAM UNDER ANESTHESIA (N/A) HEMORRHOIDECTOMY (N/A)  SURGEON:  Surgeon(s) and Role:    * Ricarda Frame, MD - Primary  PHYSICIAN ASSISTANT:   ASSISTANTS: none   ANESTHESIA:   general  EBL:  Total I/O In: 700 [I.V.:700] Out: -   BLOOD ADMINISTERED:none  DRAINS: none   LOCAL MEDICATIONS USED:  OTHER Exparel 102ml  SPECIMEN:  Source of Specimen:  hemorrhoids  DISPOSITION OF SPECIMEN:  PATHOLOGY  COUNTS:  YES  TOURNIQUET:  * No tourniquets in log *  DICTATION: .Dragon Dictation  PLAN OF CARE: Discharge to home after PACU  PATIENT DISPOSITION:  PACU - hemodynamically stable.   Delay start of Pharmacological VTE agent (>24hrs) due to surgical blood loss or risk of bleeding: not applicable

## 2016-07-15 NOTE — Discharge Instructions (Signed)
AMBULATORY SURGERY  DISCHARGE INSTRUCTIONS   1) The drugs that you were given will stay in your system until tomorrow so for the next 24 hours you should not:  A) Drive an automobile B) Make any legal decisions C) Drink any alcoholic beverage   2) You may resume regular meals tomorrow.  Today it is better to start with liquids and gradually work up to solid foods.  You may eat anything you prefer, but it is better to start with liquids, then soup and crackers, and gradually work up to solid foods.   3) Please notify your doctor immediately if you have any unusual bleeding, trouble breathing, redness and pain at the surgery site, drainage, fever, or pain not relieved by medication.    4) Additional Instructions:        Please contact your physician with any problems or Same Day Surgery at [phone], Monday through Friday 6 am to 4 pm, or [place] at [facility] number at [phone].Surgical Procedures for Hemorrhoids, Care After Refer to this sheet in the next few weeks. These instructions provide you with information about caring for yourself after your procedure. Your health care provider may also give you more specific instructions. Your treatment has been planned according to current medical practices, but problems sometimes occur. Call your health care provider if you have any problems or questions after your procedure. WHAT TO EXPECT AFTER THE PROCEDURE After your procedure, it is common to have:  Rectal pain.  Pain when you are having a bowel movement.  Slight rectal bleeding. HOME CARE INSTRUCTIONS Medicines  Take over-the-counter and prescription medicines only as told by your health care provider.  Do not drive or operate heavy machinery while taking prescription pain medicine.  Use a stool softener or a bulk laxative as told by your health care provider. Activities  Rest at home. Return to your normal activities as told by your health care  provider.  Do not lift anything that is heavier than 10 lb (4.5 kg).  Do not sit for long periods of time. Take a walk every day or as told by your health care provider.  Do not strain to have a bowel movement. Do not spend a long time sitting on the toilet. Eating and Drinking  Eat foods that contain fiber, such as whole grains, beans, nuts, fruits, and vegetables.  Drink enough fluid to keep your urine clear or pale yellow. General Instructions  Sit in a warm bath 2-3 times per day to relieve soreness or itching.  Keep all follow-up visits as told by your health care provider. This is important. SEEK MEDICAL CARE IF:  Your pain medicine is not helping.  You have a fever or chills.  You become constipated.  You have trouble passing urine. SEEK IMMEDIATE MEDICAL CARE IF:  You have very bad rectal pain.  You have heavy bleeding from your rectum.   This information is not intended to replace advice given to you by your health care provider. Make sure you discuss any questions you have with your health care provider.   Document Released: 02/21/2004 Document Revised: 08/22/2015 Document Reviewed: 02/26/2015 Elsevier Interactive Patient Education [employer]

## 2016-07-17 LAB — SURGICAL PATHOLOGY

## 2016-07-23 ENCOUNTER — Encounter: Payer: BLUE CROSS/BLUE SHIELD | Admitting: General Surgery

## 2016-07-24 ENCOUNTER — Ambulatory Visit (INDEPENDENT_AMBULATORY_CARE_PROVIDER_SITE_OTHER): Payer: BLUE CROSS/BLUE SHIELD | Admitting: General Surgery

## 2016-07-24 ENCOUNTER — Encounter: Payer: Self-pay | Admitting: General Surgery

## 2016-07-24 VITALS — BP 148/87 | HR 81 | Temp 98.7°F | Wt 182.0 lb

## 2016-07-24 DIAGNOSIS — Z4889 Encounter for other specified surgical aftercare: Secondary | ICD-10-CM

## 2016-07-24 MED ORDER — DOCUSATE SODIUM 100 MG PO CAPS: 100.0000 mg | ORAL_CAPSULE | Freq: Two times a day (BID) | ORAL | 0 refills | Status: DC

## 2016-07-24 NOTE — Progress Notes (Signed)
Outpatient Surgical Follow Up  07/24/2016  Garrett Barajas is an 50 y.o. male.   Chief Complaint  Patient presents with  . Routine Post Op    Hemorrhoidectomy Dr. Tonita CongWoodham 07/15/2016    HPI: 50 year old male returns to clinic 1 week status post redo open hemorrhoidectomy. Patient reports doing well. He is not taking any pain medications and he is been having soft bowel movements without pain or bleeding. He's noticing some clearish to pink drainage on the bandage but states this is also improving. He denies any fevers, chills, nausea, vomiting, chest pain, shortness of breath, diarrhea, constipation. He is much happier with this result then after the previous hemorrhoidectomy.  Past Medical History:  Diagnosis Date  . Dizziness   . External hemorrhoids without mention of complication   . Iron deficiency anemia    a. Hgb 6.0 on 05/18/14- started on ferrous fumarate, patient is a Jehovah's Witness  . Refusal of blood transfusions as patient is Jehovah's Witness   . Undiagnosed cardiac murmurs    HAD W/U DONE (EKG, ECHO) ALL NORMAL     Past Surgical History:  Procedure Laterality Date  . ADENOIDECTOMY    . COLONOSCOPY    . COLONOSCOPY WITH PROPOFOL N/A 04/10/2016   Procedure: COLONOSCOPY WITH PROPOFOL;  Surgeon: Midge Miniumarren Wohl, MD;  Location: [facility];  Service: Endoscopy;  Laterality: N/A;  . eye growth removal    . HEMORRHOID SURGERY N/A 06/09/2016   Procedure: HEMORRHOIDECTOMY;  Surgeon: Ricarda Frameharles Marvel Mcphillips, MD;  Location: [facility];  Service: General;  Laterality: N/A;  . HEMORRHOID SURGERY N/A 07/15/2016   Procedure: HEMORRHOIDECTOMY;  Surgeon: Ricarda Frameharles Stormy Sabol, MD;  Location: [facility];  Service: General;  Laterality: N/A;  . RECTAL EXAM UNDER ANESTHESIA N/A 07/15/2016   Procedure: RECTAL EXAM UNDER ANESTHESIA;  Surgeon: Ricarda Frameharles Macie Baum, MD;  Location: [facility];  Service: General;  Laterality: N/A;  . TONSILLECTOMY      Family History  Problem Relation Age of Onset  . Diabetes Mother    . Hypertension Mother   . Diabetes Father   . Hypertension Father   . Diabetes Maternal Grandmother     Social History:  reports that he has never smoked. He has never used smokeless tobacco. He reports that he drinks about 0.6 oz of alcohol per week . He reports that he does not use drugs.  Allergies: No Known Allergies  Medications reviewed.    ROS A multipoint review of systems was completed, all pertinent positives and negatives are documented within the history of present illness and the remainder are negative.   There were no vitals taken for this visit.  Physical Exam Gen.: No acute distress Chest: Clear to auscultation Heart: Regular rhythm Abdomen: Soft and nontender Rectal: Hemorrhoidectomy incision sites able to visualize without difficulty. Sutures remain in place. No active bleeding and nontender on exam. There is still significant external hemorrhoidal tissue that is swollen due to the suture lines.    No results found for this or any previous visit (from the past 48 hour(s)). No results found.  Assessment/Plan:  1. Aftercare following surgery 50 year old male 1 week status post redo open hemorrhoidectomy. Doing well. Discussed about the signs and symptoms of infection or worsening of his disease. He voiced understanding. He'll follow-up in clinic in 3 weeks for 1 additional wound check or as needed if things worsen.     Ricarda Frameharles Gerturde Kuba, MD FACS General Surgeon  07/24/2016,11:43 AM

## 2016-07-24 NOTE — Addendum Note (Signed)
Addended by: Adela PortsBONICHE, Wayman Hoard on: 07/24/2016 11:51 AM   Modules accepted: Orders

## 2016-07-24 NOTE — Patient Instructions (Signed)
Please give us a call if you have any questions or concerns. 

## 2016-07-25 ENCOUNTER — Encounter: Payer: BLUE CROSS/BLUE SHIELD | Admitting: General Surgery

## 2016-08-10 IMAGING — CR DG CHEST 2V
1 series · 2 of 2 positions shown · non-contrast
Comparison: None in PACs

CLINICAL DATA: Preoperative exam prior to hemorrhoids surgery, no
cardiopulmonary history other than cardiac murmur, nonsmoker.

EXAM:
CHEST  2 VIEW

[Series 1: w chest pa · 0.14mm/px · 2 of 2 slices shown]
[im 1/2]
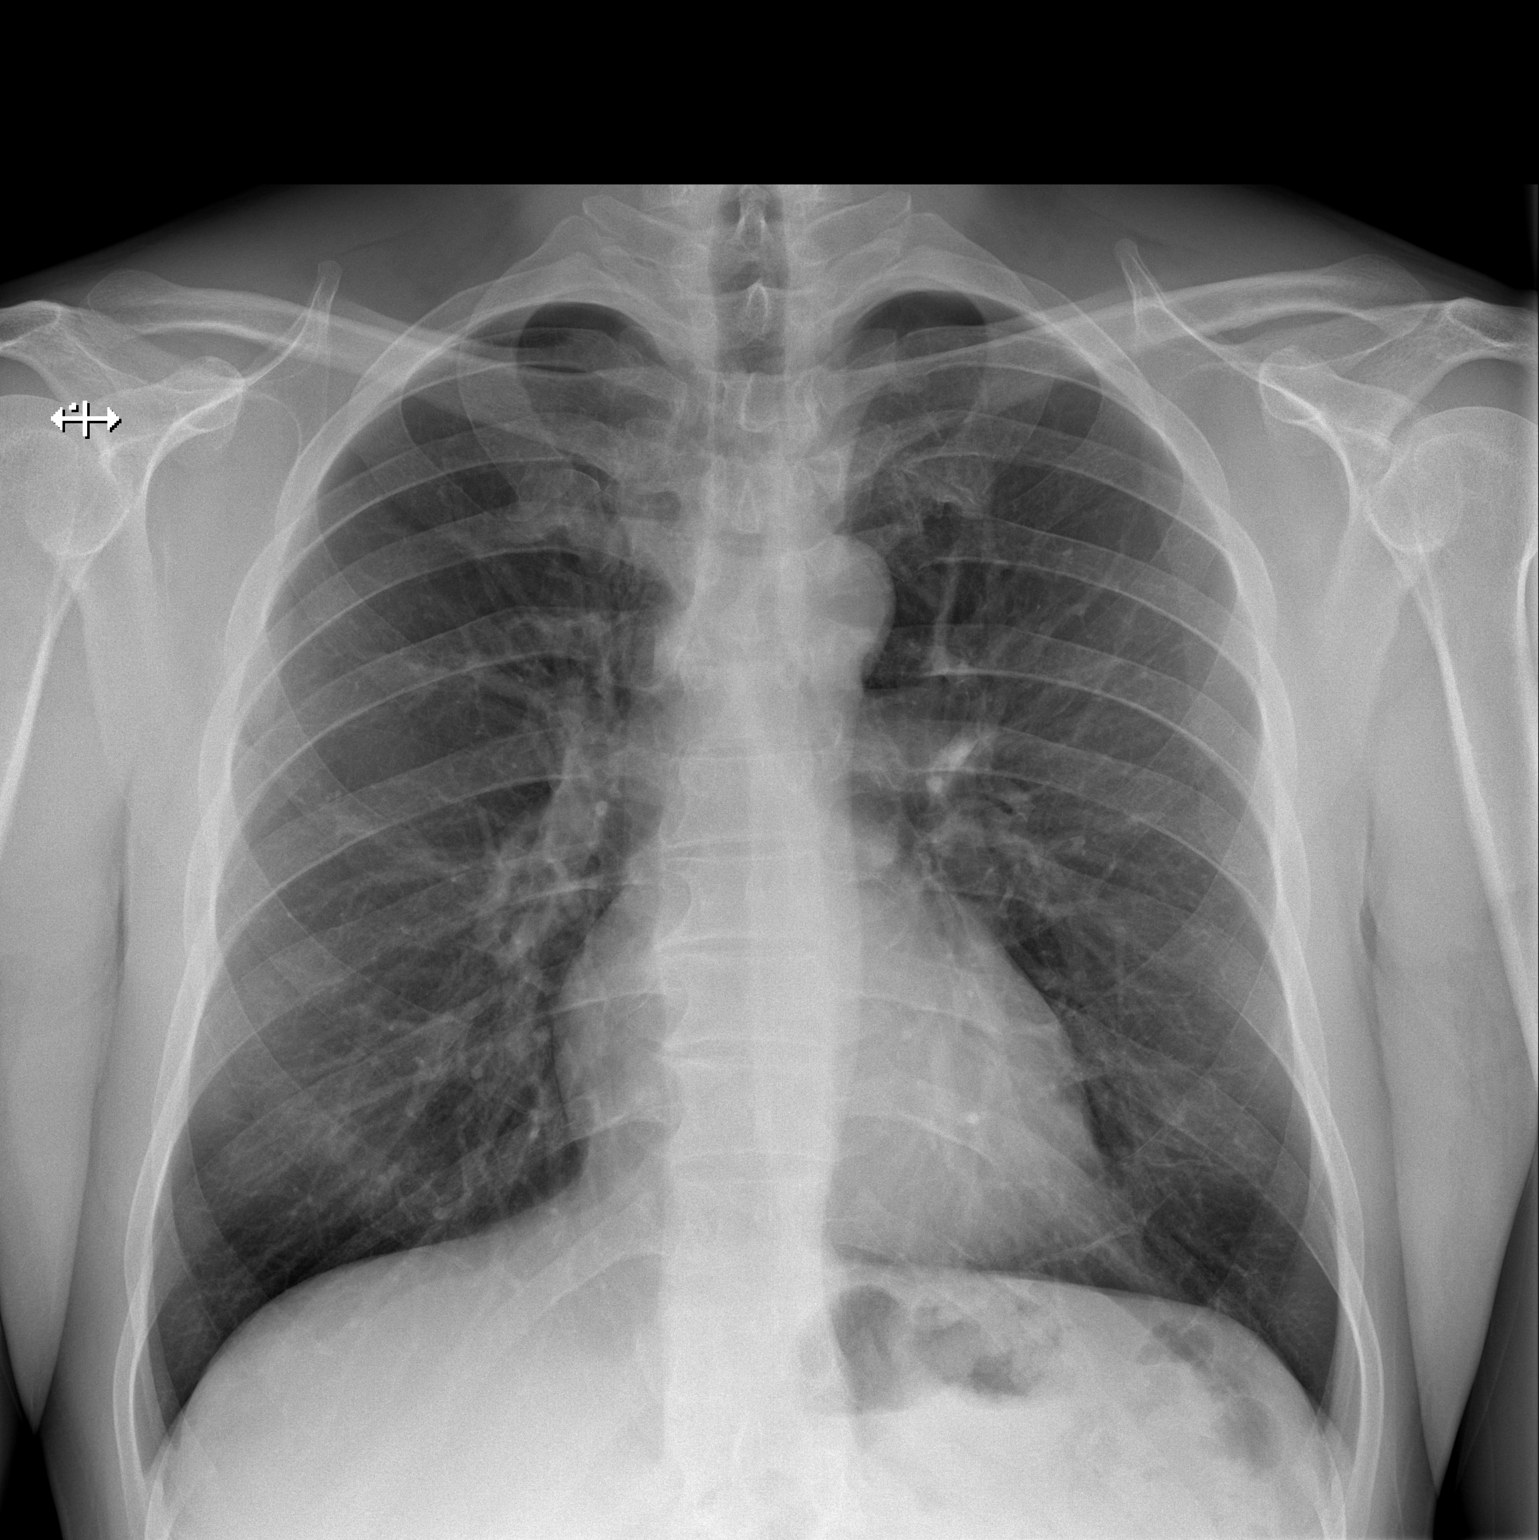
[im 2/2]
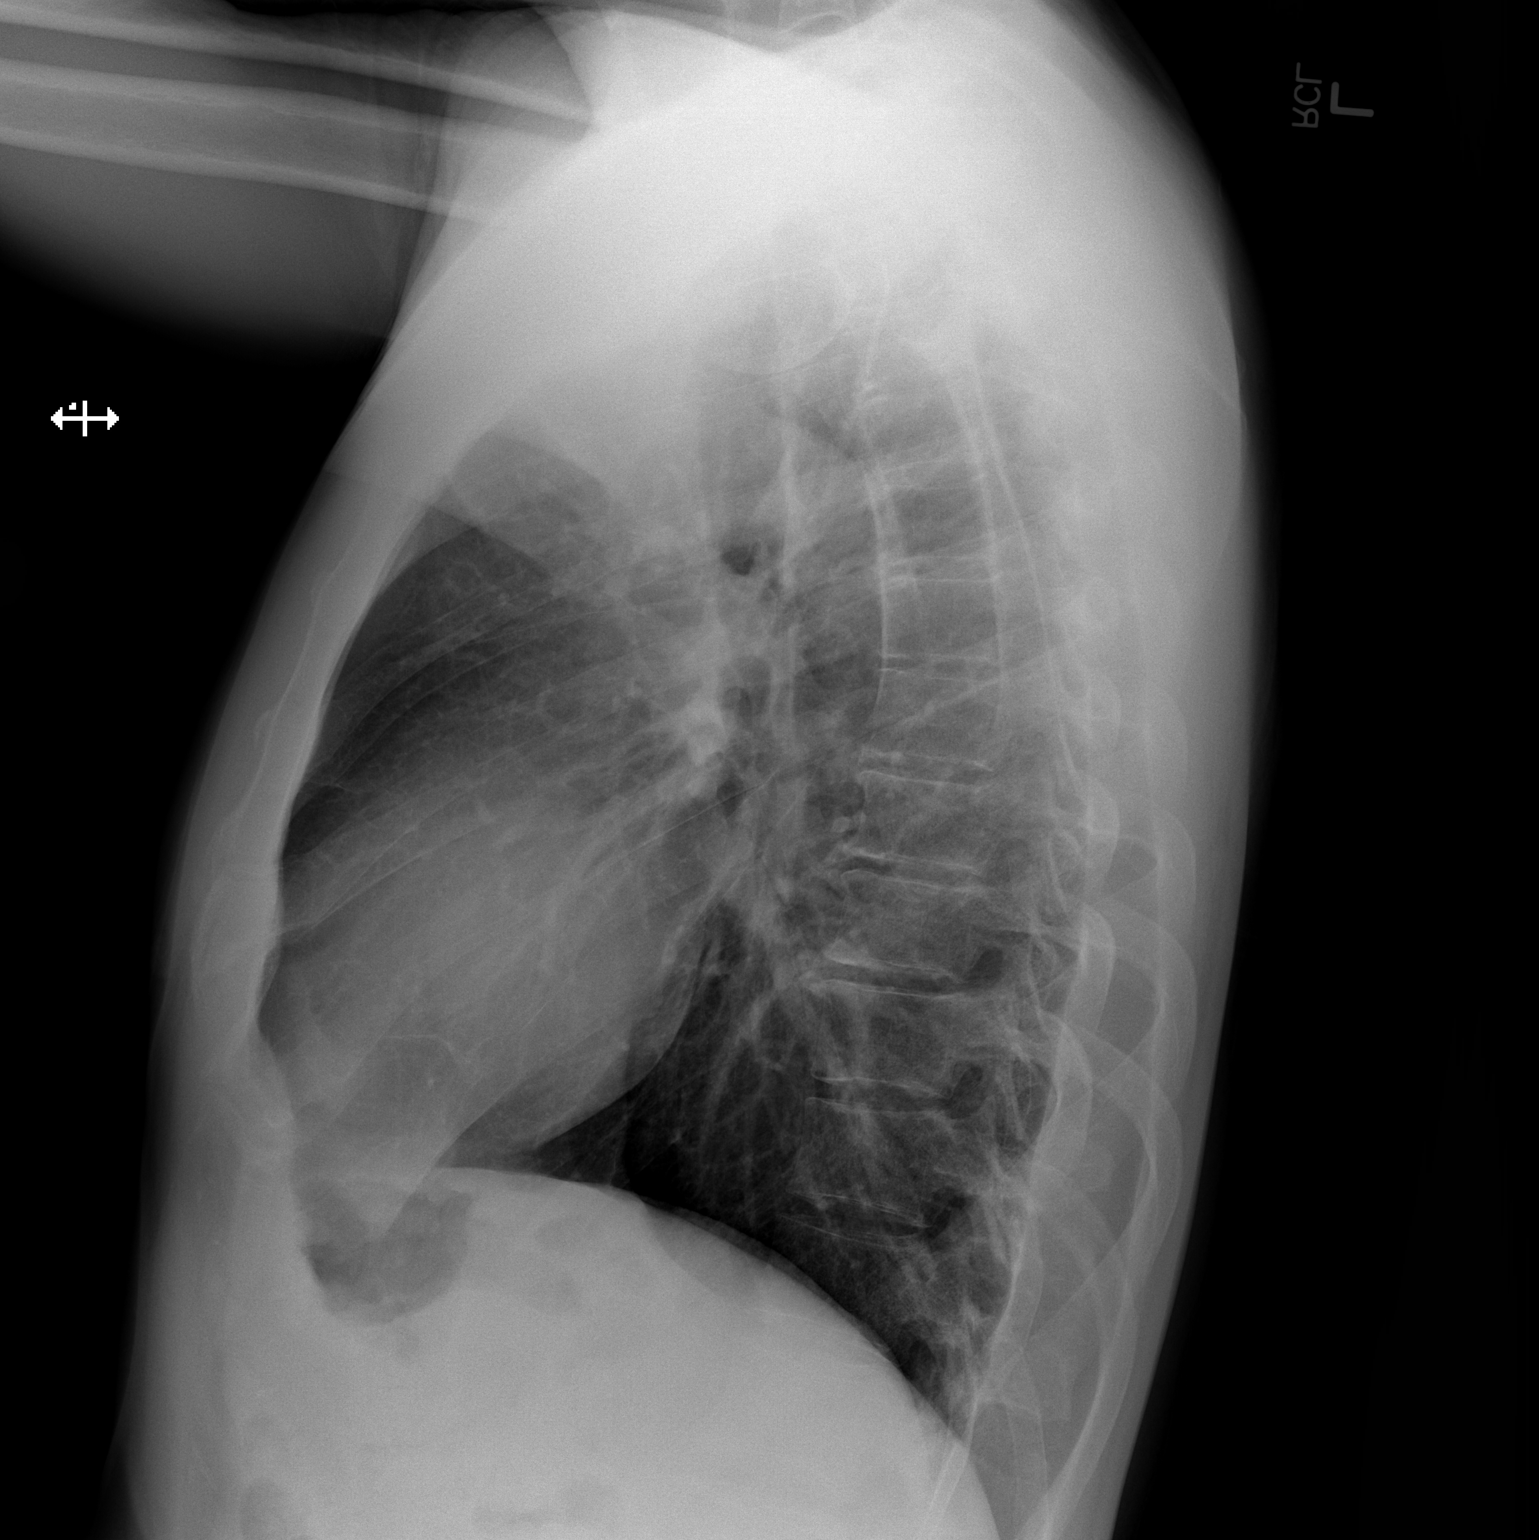

[2 of 2 positions shown; findings below may reference images not displayed]

FINDINGS: The lungs are well-expanded and clear. The heart and pulmonary
vascularity are normal. The mediastinum is normal in width. There is
no pleural effusion. The bony thorax is unremarkable.
IMPRESSION: There is no active cardiopulmonary disease.

## 2016-08-28 ENCOUNTER — Encounter: Payer: Self-pay | Admitting: General Surgery

## 2016-08-28 ENCOUNTER — Ambulatory Visit (INDEPENDENT_AMBULATORY_CARE_PROVIDER_SITE_OTHER): Payer: BLUE CROSS/BLUE SHIELD | Admitting: General Surgery

## 2016-08-28 VITALS — BP 135/87 | HR 73 | Temp 98.0°F | Ht 69.0 in | Wt 183.4 lb

## 2016-08-28 DIAGNOSIS — Z4889 Encounter for other specified surgical aftercare: Secondary | ICD-10-CM

## 2016-08-28 NOTE — Progress Notes (Signed)
Outpatient Surgical Follow Up  08/28/2016  Garrett Barajas is an 50 y.o. male.   Chief Complaint  Patient presents with  . Routine Post Op    Hemorrhoidectomy (8/1)- Dr. Tonita CongWoodham    HPI: 50 year old male returns to clinic for 1 additional follow-up after a second open hemorrhoidectomy. Patient reports she is doing much better. He denies any pain, fevers, chills, nausea, vomiting, diarrhea, constant. He does note on an occasional of a single dot of blood with wiping. He states this is getting less and less. He's been very happy with the surgical experience and the rectal pain that he had before this process started has completely resolved.  Past Medical History:  Diagnosis Date  . Dizziness   . External hemorrhoids without mention of complication   . Iron deficiency anemia    a. Hgb 6.0 on 05/18/14- started on ferrous fumarate, patient is a Jehovah's Witness  . Refusal of blood transfusions as patient is Jehovah's Witness   . Undiagnosed cardiac murmurs    HAD W/U DONE (EKG, ECHO) ALL NORMAL     Past Surgical History:  Procedure Laterality Date  . ADENOIDECTOMY    . COLONOSCOPY    . COLONOSCOPY WITH PROPOFOL N/A 04/10/2016   Procedure: COLONOSCOPY WITH PROPOFOL;  Surgeon: Midge Miniumarren Wohl, MD;  [facility];  Service: Endoscopy;  Laterality: N/A;  . eye growth removal    . HEMORRHOID SURGERY N/A 06/09/2016   Procedure: HEMORRHOIDECTOMY;  Surgeon: Ricarda Frameharles Estefanie Cornforth, MD;  Location: [facility];  Service: General;  Laterality: N/A;  . HEMORRHOID SURGERY N/A 07/15/2016   Procedure: HEMORRHOIDECTOMY;  Surgeon: Ricarda Frameharles Nikash Mortensen, MD;  Location: [facility];  Service: General;  Laterality: N/A;  . RECTAL EXAM UNDER ANESTHESIA N/A 07/15/2016   Procedure: RECTAL EXAM UNDER ANESTHESIA;  Surgeon: Ricarda Frameharles Kathlene Yano, MD;  Location: [facility];  Service: General;  Laterality: N/A;  . TONSILLECTOMY      Family History  Problem Relation Age of Onset  . Diabetes Mother   . Hypertension Mother   .  Diabetes Father   . Hypertension Father   . Diabetes Maternal Grandmother     Social History:  reports that he has never smoked. He has never used smokeless tobacco. He reports that he drinks about 0.6 oz of alcohol per week . He reports that he does not use drugs.  Allergies: No Known Allergies  Medications reviewed.    ROS A multipoint review of systems was completed, all pertinent positives and negatives are documented within the history of present illness and remainder are negative.   BP 135/87   Pulse 73   Temp 98 F (36.7 C) (Oral)   Ht 5\' 9"  (1.753 m)   Wt 83.2 kg (183 lb 6.4 oz)   BMI 27.08 kg/m   Physical Exam Gen.: No acute distress Chest: Clear to auscultation Heart: Regular rhythm Abdomen: Soft and nontender Rectum: Well-healed open hemorrhoidectomy incision sites without evidence of active bleeding. Normal sphincter tone. No blood on exam.    No results found for this or any previous visit (from the past 48 hour(s)). No results found.  Assessment/Plan:  1. Aftercare following surgery 50 year old male doing well status post open hemorrhoidectomy. Discussed the causes of hemorrhoidal disease and counseled him on techniques to limit their recurrence. He voiced understanding and will follow-up in clinic on an as-needed basis.     Ricarda Frameharles Rekita Miotke, MD FACS General Surgeon  08/28/2016,9:22 AM

## 2016-08-28 NOTE — Patient Instructions (Signed)
Keep stools as soft as possible to avoid constipation. You may use stool softeners, fiber, or Miralax to help with this.  Please call with any questions or concerns.

## 2018-08-12 ENCOUNTER — Encounter: Payer: Self-pay | Admitting: Family Medicine

## 2018-08-12 ENCOUNTER — Ambulatory Visit (INDEPENDENT_AMBULATORY_CARE_PROVIDER_SITE_OTHER): Payer: BLUE CROSS/BLUE SHIELD | Admitting: Family Medicine

## 2018-08-12 VITALS — BP 142/93 | HR 67 | Temp 97.8°F | Ht 68.1 in | Wt 181.2 lb

## 2018-08-12 DIAGNOSIS — Z13220 Encounter for screening for lipoid disorders: Secondary | ICD-10-CM

## 2018-08-12 DIAGNOSIS — Z125 Encounter for screening for malignant neoplasm of prostate: Secondary | ICD-10-CM | POA: Diagnosis not present

## 2018-08-12 DIAGNOSIS — Z862 Personal history of diseases of the blood and blood-forming organs and certain disorders involving the immune mechanism: Secondary | ICD-10-CM | POA: Diagnosis not present

## 2018-08-12 DIAGNOSIS — Z833 Family history of diabetes mellitus: Secondary | ICD-10-CM

## 2018-08-12 DIAGNOSIS — H538 Other visual disturbances: Secondary | ICD-10-CM | POA: Diagnosis not present

## 2018-08-12 LAB — UA/M W/RFLX CULTURE, ROUTINE
BILIRUBIN UA: NEGATIVE
Glucose, UA: NEGATIVE
KETONES UA: NEGATIVE
LEUKOCYTES UA: NEGATIVE
Nitrite, UA: NEGATIVE
PROTEIN UA: NEGATIVE
SPEC GRAV UA: 1.015 (ref 1.005–1.030)
Urobilinogen, Ur: 0.2 mg/dL (ref 0.2–1.0)
pH, UA: 5.5 (ref 5.0–7.5)

## 2018-08-12 LAB — MICROSCOPIC EXAMINATION: Bacteria, UA: NONE SEEN

## 2018-08-12 LAB — BAYER DCA HB A1C WAIVED: HB A1C (BAYER DCA - WAIVED): 5.6 % (ref <7.0)

## 2018-08-12 MED ORDER — ERYTHROMYCIN 5 MG/GM OP OINT: 1.0000 "application " | TOPICAL_OINTMENT | Freq: Every day | OPHTHALMIC | 0 refills | Status: DC

## 2018-08-12 MED ORDER — GLYCERIN-HYPROMELLOSE-PEG 400 0.2-0.2-1 % OP SOLN: 1.0000 [drp] | OPHTHALMIC | 12 refills | Status: DC

## 2018-08-12 NOTE — Progress Notes (Signed)
BP (!) 142/93 (BP Location: Left Arm, Patient Position: Sitting, Cuff Size: Normal)   Pulse 67   Temp 97.8 F (36.6 C)   Ht 5' 8.1" (1.73 m)   Wt 181 lb 4 oz (82.2 kg)   SpO2 98%   BMI 27.48 kg/m    Subjective:    Patient ID: Garrett Barajas, male    DOB: November 15, 1966, 52 y.o.   MRN: [id number]  HPI: Garrett Barajas is a 52 y.o. male who presents today to establish care.  Chief Complaint  Patient presents with  . Headache  . sinus pressure  . Establish Care   Has been having some problems with his vision. Has been a little blurry in the AM when he first wakes up. He has been having a headache in his forehead and pressure in his sinuses. He has a strong family history of diabetes and is concerned about that.  UPPER RESPIRATORY TRACT INFECTION Duration: couple of months Worst symptom: headache, sinus pressure Fever: no Cough: no Shortness of breath: no Wheezing: no Chest pain: no Chest tightness: no Chest congestion: no Nasal congestion: yes Runny nose: no Post nasal drip: no Sneezing: no Sore throat: no Swollen glands: no Sinus pressure: yes Headache: yes Face pain: no Toothache: no Ear pain: no  Ear pressure: no  Eyes red/itching:yes Eye drainage/crusting: no  Vomiting: no Rash: no Fatigue: no Sick contacts: no Strep contacts: no  Context: stable Recurrent sinusitis: no Relief with OTC cold/cough medications: no  Treatments attempted: none   Active Ambulatory Problems    Diagnosis Date Noted  . External hemorrhoids   . Undiagnosed cardiac murmurs   . Dizziness   . Iron deficiency anemia   . Prolapsed internal hemorrhoids, grade 3 12/03/2015  . Cardiac murmur 02/10/2016  . Orthostatic dizziness 02/10/2016  . Blood in stool   . Third degree hemorrhoids   . Other hemorrhoids    Resolved Ambulatory Problems    Diagnosis Date Noted  . No Resolved Ambulatory Problems   Past Medical History:  Diagnosis Date  . External hemorrhoids without mention of  complication   . Refusal of blood transfusions as patient is Jehovah's Witness    Past Surgical History:  Procedure Laterality Date  . ADENOIDECTOMY    . COLONOSCOPY    . COLONOSCOPY WITH PROPOFOL N/A 04/10/2016   Procedure: COLONOSCOPY WITH PROPOFOL;  Surgeon: Midge Minium, MD;  [facility];  Service: Endoscopy;  Laterality: N/A;  . eye growth removal    . HEMORRHOID SURGERY N/A 06/09/2016   Procedure: HEMORRHOIDECTOMY;  Surgeon: Ricarda Frame, MD;  Location: [facility];  Service: General;  Laterality: N/A;  . HEMORRHOID SURGERY N/A 07/15/2016   Procedure: HEMORRHOIDECTOMY;  Surgeon: Ricarda Frame, MD;  Location: [facility];  Service: General;  Laterality: N/A;  . RECTAL EXAM UNDER ANESTHESIA N/A 07/15/2016   Procedure: RECTAL EXAM UNDER ANESTHESIA;  Surgeon: Ricarda Frame, MD;  Location: [facility];  Service: General;  Laterality: N/A;  . TONSILLECTOMY     No medication comments found.  No Known Allergies  Social History   Socioeconomic History  . Marital status: Married    Spouse name: Not on file  . Number of children: Not on file  . Years of education: Not on file  . Highest education level: Not on file  Occupational History  . Not on file  Social Needs  . Financial resource strain: Not on file  . Food insecurity:    Worry: Not on file  Inability: Not on file  . Transportation needs:    Medical: Not on file    Non-medical: Not on file  Tobacco Use  . Smoking status: Never Smoker  . Smokeless tobacco: Never Used  Substance and Sexual Activity  . Alcohol use: Yes    Alcohol/week: 1.0 standard drinks    Types: 1 Glasses of wine per week    Comment: occ  . Drug use: No  . Sexual activity: Not Currently  Lifestyle  . Physical activity:    Days per week: Not on file    Minutes per session: Not on file  . Stress: Not on file  Relationships  . Social connections:    Talks on phone: Not on file    Gets together: Not on file    Attends religious  service: Not on file    Active member of club or organization: Not on file    Attends meetings of clubs or organizations: Not on file    Relationship status: Not on file  Other Topics Concern  . Not on file  Social History Narrative  . Not on file   Family History  Problem Relation Age of Onset  . Diabetes Mother   . Hypertension Mother   . Arthritis Mother   . Diabetes Father   . Hypertension Father   . Arthritis Father   . Diabetes Maternal Grandmother   . Hypertension Maternal Grandmother   . Hypertension Paternal Grandfather     Review of Systems  Constitutional: Negative.   HENT: Negative.   Eyes: Positive for redness and visual disturbance. Negative for photophobia, pain, discharge and itching.  Respiratory: Negative.   Cardiovascular: Negative.   Gastrointestinal: Negative.   Neurological: Negative.   Psychiatric/Behavioral: Negative.     Per HPI unless specifically indicated above     Objective:    BP (!) 142/93 (BP Location: Left Arm, Patient Position: Sitting, Cuff Size: Normal)   Pulse 67   Temp 97.8 F (36.6 C)   Ht 5' 8.1" (1.73 m)   Wt 181 lb 4 oz (82.2 kg)   SpO2 98%   BMI 27.48 kg/m   Wt Readings from Last 3 Encounters:  08/12/18 181 lb 4 oz (82.2 kg)  08/28/16 183 lb 6.4 oz (83.2 kg)  07/24/16 182 lb (82.6 kg)     Visual Acuity Screening   Right eye Left eye Both eyes  Without correction: 20/20 20/15 20/20   With correction:       Physical Exam  Constitutional: He is oriented to person, place, and time. He appears well-developed and well-nourished. No distress.  HENT:  Head: Normocephalic and atraumatic.  Right Ear: Hearing normal.  Left Ear: Hearing normal.  Nose: Nose normal.  Mouth/Throat: Oropharynx is clear and moist.  Eyes: Pupils are equal, round, and reactive to light. EOM and lids are normal. Right eye exhibits no discharge. Left eye exhibits no discharge. Right conjunctiva is injected. Left conjunctiva is injected. No scleral  icterus. Right eye exhibits normal extraocular motion and no nystagmus. Left eye exhibits normal extraocular motion and no nystagmus. Right pupil is round and reactive. Left pupil is round and reactive. Pupils are equal.  Neck: Normal range of motion. Neck supple. No JVD present. No neck rigidity. No tracheal deviation present. No Brudzinski's sign and no Kernig's sign noted.  Cardiovascular: Normal rate, regular rhythm and normal heart sounds. Exam reveals no gallop and no friction rub.  No murmur heard. Pulmonary/Chest: Effort normal and breath sounds normal. No stridor.  No respiratory distress. He has no wheezes. He has no rales. He exhibits no tenderness.  Musculoskeletal: Normal range of motion.  Lymphadenopathy:    He has no cervical adenopathy.  Neurological: He is alert and oriented to person, place, and time.  Skin: Skin is warm, dry and intact. Capillary refill takes less than 2 seconds. No rash noted. He is not diaphoretic. No cyanosis or erythema. No pallor.  Psychiatric: He has a normal mood and affect. His speech is normal and behavior is normal. Judgment and thought content normal. Cognition and memory are normal.    Results for orders placed or performed during the hospital encounter of 07/15/16  Surgical pathology  Result Value Ref Range   SURGICAL PATHOLOGY      Surgical Pathology CASE: [id number] PATIENT: Jourdyn Labriola Surgical Pathology Report     SPECIMEN SUBMITTED: A. Hemorrhoids  CLINICAL HISTORY: None provided  PRE-OPERATIVE DIAGNOSIS: External hemorrhoids  POST-OPERATIVE DIAGNOSIS: Same as pre-op     DIAGNOSIS: A. HEMORRHOIDS; HEMORRHOIDECTOMY: - HEMORRHOIDS. - NEGATIVE FOR DYSPLASIA AND MALIGNANCY.   GROSS DESCRIPTION:  A. Labeled: hemorrhoids  Tissue fragment(s): 4  Size: aggregate, 5.5 x 2.3 x 1.3 cm  Description: unoriented irregularly shaped gray to pink fragments of skin and mucosa, inked blue, sectioned  Representative  submitted in 1-2 cassette(s).    Final Diagnosis performed by Glenice Bow, MD.  Electronically signed 07/17/2016 5:04:05PM    The electronic signature indicates that the named Attending Pathologist has evaluated the specimen  Technical component performed at [facility], [address] [place], [place] [address] Lab: [phone] Dir: Titus Dubin. Cato Mulligan, MD  Professio nal component performed at [facility], [facility], [address] [place], [place], [place] [address] Lab: [phone] Dir: Georgiann Cocker. Oneita Kras, MD        Assessment & Plan:   Problem List Items Addressed This Visit    None    Visit Diagnoses    Blurred vision    -  Primary   Vision normal. Possibly due to dry eye. Will start lubricating drops and recheck in about 3-4 weeks. Call with any concerns.    Relevant Orders   Bayer DCA Hb A1c Waived   CBC with Differential/Platelet   Comprehensive metabolic panel   TSH   History of anemia       Labs drawn today. Await results. Call with any concerns.    Relevant Orders   CBC with Differential/Platelet   Iron and TIBC   Ferritin   Screening for cholesterol level       Labs drawn today. Await results. Call with any concerns.    Relevant Orders   Lipid Panel w/o Chol/HDL Ratio   Screening for prostate cancer       Labs drawn today. Await results. Call with any concerns.    Relevant Orders   PSA   Family history of diabetes mellitus (DM)       Labs drawn today. Await results. Call with any concerns.    Relevant Orders   Bayer DCA Hb A1c Waived   UA/M w/rflx Culture, Routine       Follow up plan: Return in about 4 weeks (around 09/09/2018) for Physical.

## 2018-08-12 NOTE — Patient Instructions (Signed)
Dry Eye Dry eye, also called keratoconjunctivitis sicca, is dryness of the membranes surrounding the eye. It happens when there are not enough healthy, natural tears in the eyes. The eyes must remain moist at all times. A small amount of tears is constantly produced by the tear glands (lacrimal glands). These glands are located under the outside part of the upper eyelids. Dryness of the eyes can be a symptom of a variety of conditions, such as rheumatoid arthritis, lupus, or Sjgren syndrome. Dry eye may be mild to severe. What are the causes? This condition may be caused by:  Not making enough tears (aqueous tear-deficient dry eyes).  Tears evaporating from the eye too quickly (evaporative dry eyes). This is when there is an abnormality in the quality of your tears. This abnormality causes your tears to evaporate so quickly that the eye cannot be kept moist.  What increases the risk? This condition is more likely to happen in:  Women, especially those who have gone through menopause.  People in dry climates.  People in dusty or smoky areas.  People who take certain medicines, such as: ? Anti-allergy medicines (antihistamines). ? Blood pressure medicines (antihypertensives). ? Birth control pills (oral contraceptives). ? Laxatives. ? Tranquilizers.  What are the signs or symptoms? Symptoms in the eyes may include:  Irritation.  Itchiness.  Redness.  Burning.  Inflammation of the eyelids.  Feeling as though something is stuck in the eye.  Light sensitivity.  Increased sensitivity and discomfort when wearing contact lenses, if this applies.  Vision that varies throughout the day.  Occasional excessive tearing.  How is this diagnosed? This condition is diagnosed based on your symptoms, your medical history, and an eye exam. Your health care provider may look at your eye using a microscope and may put dyes in your eye to check the health of the surface of your eye. You  may have a tests, such as a test to evaluate your tear production (Schirmer test). During this test, a small strip of special paper is gently pressed into the inner corner of your eye. Your tear production is measured by how much of the paper is moistened by your tears during a set amount of time. You may be referred to a health care provider who specializes in eyes and eyesight (ophthalmologist). How is this treated? This condition is often treated at home. Your health care provider may recommend eye drops, which are also called artificial tears. If your condition is severe, treatment may include:  Prescription eye drops.  Over-the-counter or prescription ointments to moisten your eyes.  Minor surgery to block tears from going into your nose.  Medicines to reduce inflammation of the eyelids.  Follow these instructions at home:  Take, use, or apply over-the-counter and prescription medicines only as told by your health care provider. This includes eye drops.  If directed, apply a warm compress to your eyes to help reduce inflammation. Place a towel over your eyes and gently press the warm compress over your eyes for about 5 minutes, or as long as told by your health care provider.  If possible, avoid dry, drafty environments.  Use a humidifier at home to increase moisture in the air.  If you wear contact lenses, remove them regularly to give your eyes a break. Always remove contacts before sleeping.  Keep all follow-up visits as told by your health care provider. This is important. This includes yearly eye exams and vision tests. Contact a health care provider if:    You have eye pain.  You have pus-like fluid coming from your eye.  Your symptoms get worse or do not improve with treatment. Get help right away if:  Your vision suddenly changes. This information is not intended to replace advice given to you by your health care provider. Make sure you discuss any questions you have  with your health care provider. Document Released: 10/18/2004 Document Revised: 05/08/2016 Document Reviewed: 09/26/2015 Elsevier Interactive Patient Education  2018 [employer]vier Inc[employer]  

## 2018-08-13 ENCOUNTER — Encounter: Payer: Self-pay | Admitting: Family Medicine

## 2018-08-13 LAB — CBC WITH DIFFERENTIAL/PLATELET
BASOS ABS: 0.1 10*3/uL (ref 0.0–0.2)
Basos: 1 %
EOS (ABSOLUTE): 0.7 10*3/uL — ABNORMAL HIGH (ref 0.0–0.4)
Eos: 9 %
Hematocrit: 46.6 % (ref 37.5–51.0)
Hemoglobin: 16.1 g/dL (ref 13.0–17.7)
Immature Grans (Abs): 0 10*3/uL (ref 0.0–0.1)
Immature Granulocytes: 0 %
Lymphocytes Absolute: 2.9 10*3/uL (ref 0.7–3.1)
Lymphs: 38 %
MCH: 31.1 pg (ref 26.6–33.0)
MCHC: 34.5 g/dL (ref 31.5–35.7)
MCV: 90 fL (ref 79–97)
Monocytes Absolute: 0.5 10*3/uL (ref 0.1–0.9)
Monocytes: 6 %
NEUTROS ABS: 3.4 10*3/uL (ref 1.4–7.0)
Neutrophils: 46 %
Platelets: 284 10*3/uL (ref 150–450)
RBC: 5.18 x10E6/uL (ref 4.14–5.80)
RDW: 13.8 % (ref 12.3–15.4)
WBC: 7.5 10*3/uL (ref 3.4–10.8)

## 2018-08-13 LAB — IRON AND TIBC
IRON SATURATION: 30 % (ref 15–55)
Iron: 121 ug/dL (ref 38–169)
TIBC: 398 ug/dL (ref 250–450)
UIBC: 277 ug/dL (ref 111–343)

## 2018-08-13 LAB — COMPREHENSIVE METABOLIC PANEL
ALK PHOS: 85 IU/L (ref 39–117)
ALT: 24 IU/L (ref 0–44)
AST: 24 IU/L (ref 0–40)
Albumin/Globulin Ratio: 1.7 (ref 1.2–2.2)
Albumin: 5 g/dL (ref 3.5–5.5)
BILIRUBIN TOTAL: 0.4 mg/dL (ref 0.0–1.2)
BUN / CREAT RATIO: 18 (ref 9–20)
BUN: 16 mg/dL (ref 6–24)
CHLORIDE: 97 mmol/L (ref 96–106)
CO2: 27 mmol/L (ref 20–29)
Calcium: 9.9 mg/dL (ref 8.7–10.2)
Creatinine, Ser: 0.89 mg/dL (ref 0.76–1.27)
GFR calc Af Amer: 115 mL/min/{1.73_m2} (ref >59)
GFR calc non Af Amer: 100 mL/min/{1.73_m2} (ref >59)
GLUCOSE: 91 mg/dL (ref 65–99)
Globulin, Total: 2.9 g/dL (ref 1.5–4.5)
Potassium: 4.2 mmol/L (ref 3.5–5.2)
Sodium: 140 mmol/L (ref 134–144)
Total Protein: 7.9 g/dL (ref 6.0–8.5)

## 2018-08-13 LAB — LIPID PANEL W/O CHOL/HDL RATIO
Cholesterol, Total: 193 mg/dL (ref 100–199)
HDL: 67 mg/dL (ref >39)
LDL Calculated: 94 mg/dL (ref 0–99)
Triglycerides: 161 mg/dL — ABNORMAL HIGH (ref 0–149)
VLDL CHOLESTEROL CAL: 32 mg/dL (ref 5–40)

## 2018-08-13 LAB — FERRITIN: FERRITIN: 47 ng/mL (ref 30–400)

## 2018-08-13 LAB — TSH: TSH: 1.75 u[IU]/mL (ref 0.450–4.500)

## 2018-08-13 LAB — PSA: Prostate Specific Ag, Serum: 1.1 ng/mL (ref 0.0–4.0)

## 2018-09-16 ENCOUNTER — Ambulatory Visit: Payer: BLUE CROSS/BLUE SHIELD | Admitting: Family Medicine

## 2018-09-16 ENCOUNTER — Encounter: Payer: Self-pay | Admitting: Family Medicine

## 2018-09-16 VITALS — BP 135/87 | HR 61 | Temp 97.3°F | Ht 68.0 in | Wt 181.9 lb

## 2018-09-16 DIAGNOSIS — Z0000 Encounter for general adult medical examination without abnormal findings: Secondary | ICD-10-CM

## 2018-09-16 NOTE — Patient Instructions (Signed)

## 2018-09-16 NOTE — Progress Notes (Signed)
BP 135/87   Pulse 61   Temp (!) 97.3 F (36.3 C) (Oral)   Ht 5\' 8"  (1.727 m)   Wt 181 lb 14.4 oz (82.5 kg)   SpO2 98%   BMI 27.66 kg/m    Subjective:    Patient ID: Garrett Barajas, male    DOB: 1966-05-31, 52 y.o.   MRN: [id number]  HPI: Garrett Barajas is a 52 y.o. male presenting on 09/16/2018 for comprehensive medical examination. Current medical complaints include: none  He currently lives with: wife Interim Problems from his last visit: no  Depression Screen done today and results listed below:  Depression screen [facility] 2/9 09/16/2018 08/12/2018  Decreased Interest 0 0  Down, Depressed, Hopeless 0 0  PHQ - 2 Score 0 0  Altered sleeping 0 -  Tired, decreased energy 0 -  Change in appetite 0 -  Feeling bad or failure about yourself  0 -  Trouble concentrating 0 -  Moving slowly or fidgety/restless 0 -  Suicidal thoughts 0 -  PHQ-9 Score 0 -    Past Medical History:  Past Medical History:  Diagnosis Date  . Dizziness   . External hemorrhoids without mention of complication   . Iron deficiency anemia    a. Hgb 6.0 on 05/18/14- started on ferrous fumarate, patient is a [employer]  . Refusal of blood transfusions as patient is [employer]   . Undiagnosed cardiac murmurs    HAD W/U DONE (EKG, ECHO) ALL NORMAL     Surgical History:  Past Surgical History:  Procedure Laterality Date  . ADENOIDECTOMY    . COLONOSCOPY    . COLONOSCOPY WITH PROPOFOL N/A 04/10/2016   Procedure: COLONOSCOPY WITH PROPOFOL;  Surgeon: Midge Minium, MD;  [facility];  Service: Endoscopy;  Laterality: N/A;  . eye growth removal    . HEMORRHOID SURGERY N/A 06/09/2016   Procedure: HEMORRHOIDECTOMY;  Surgeon: Ricarda Frame, MD;  Location: [facility];  Service: General;  Laterality: N/A;  . HEMORRHOID SURGERY N/A 07/15/2016   Procedure: HEMORRHOIDECTOMY;  Surgeon: Ricarda Frame, MD;  Location: [facility];  Service: General;  Laterality: N/A;  . RECTAL EXAM UNDER ANESTHESIA  N/A 07/15/2016   Procedure: RECTAL EXAM UNDER ANESTHESIA;  Surgeon: Ricarda Frame, MD;  Location: [facility];  Service: General;  Laterality: N/A;  . TONSILLECTOMY      Medications:  Current Outpatient Medications on File Prior to Visit  Medication Sig  . erythromycin ophthalmic ointment Place 1 application into both eyes at bedtime. For 5 days  . Glycerin-Hypromellose-PEG 400 (ARTIFICIAL TEARS) 0.2-0.2-1 % SOLN Apply 1 drop to eye every 3 (three) hours while awake.   No current facility-administered medications on file prior to visit.     Allergies:  No Known Allergies  Social History:  Social History   Socioeconomic History  . Marital status: Married    Spouse name: Not on file  . Number of children: Not on file  . Years of education: Not on file  . Highest education level: Not on file  Occupational History  . Not on file  Social Needs  . Financial resource strain: Not on file  . Food insecurity:    Worry: Not on file    Inability: Not on file  . Transportation needs:    Medical: Not on file    Non-medical: Not on file  Tobacco Use  . Smoking status: Never Smoker  . Smokeless tobacco: Never Used  Substance and Sexual Activity  . Alcohol use:  Yes    Alcohol/week: 1.0 standard drinks    Types: 1 Glasses of wine per week    Comment: occ  . Drug use: No  . Sexual activity: Not Currently  Lifestyle  . Physical activity:    Days per week: Not on file    Minutes per session: Not on file  . Stress: Not on file  Relationships  . Social connections:    Talks on phone: Not on file    Gets together: Not on file    Attends religious service: Not on file    Active member of club or organization: Not on file    Attends meetings of clubs or organizations: Not on file    Relationship status: Not on file  . Intimate partner violence:    Fear of current or ex partner: Not on file    Emotionally abused: Not on file    Physically abused: Not on file    Forced sexual  activity: Not on file  Other Topics Concern  . Not on file  Social History Narrative  . Not on file   Social History   Tobacco Use  Smoking Status Never Smoker  Smokeless Tobacco Never Used   Social History   Substance and Sexual Activity  Alcohol Use Yes  . Alcohol/week: 1.0 standard drinks  . Types: 1 Glasses of wine per week   Comment: occ    Family History:  Family History  Problem Relation Age of Onset  . Diabetes Mother   . Hypertension Mother   . Arthritis Mother   . Diabetes Father   . Hypertension Father   . Arthritis Father   . Diabetes Maternal Grandmother   . Hypertension Maternal Grandmother   . Hypertension Paternal Grandfather     Past medical history, surgical history, medications, allergies, family history and social history reviewed with patient today and changes made to appropriate areas of the chart.   Review of Systems  Constitutional: Negative.   HENT: Negative.   Eyes: Negative.   Respiratory: Negative.   Cardiovascular: Negative.   Gastrointestinal: Negative.   Genitourinary: Negative.   Musculoskeletal: Negative.   Skin: Negative.   Neurological: Negative.   Endo/Heme/Allergies: Positive for polydipsia (with the heat). Negative for environmental allergies. Does not bruise/bleed easily.  Psychiatric/Behavioral: Negative.     All other ROS negative except what is listed above and in the HPI.      Objective:    BP 135/87   Pulse 61   Temp (!) 97.3 F (36.3 C) (Oral)   Ht 5\' 8"  (1.727 m)   Wt 181 lb 14.4 oz (82.5 kg)   SpO2 98%   BMI 27.66 kg/m   Wt Readings from Last 3 Encounters:  09/16/18 181 lb 14.4 oz (82.5 kg)  08/12/18 181 lb 4 oz (82.2 kg)  08/28/16 183 lb 6.4 oz (83.2 kg)    Physical Exam  Constitutional: He is oriented to person, place, and time. He appears well-developed and well-nourished. No distress.  HENT:  Head: Normocephalic and atraumatic.  Right Ear: Hearing, tympanic membrane, external ear and ear  canal normal.  Left Ear: Hearing, tympanic membrane, external ear and ear canal normal.  Nose: Nose normal.  Mouth/Throat: Uvula is midline, oropharynx is clear and moist and mucous membranes are normal. No oropharyngeal exudate.  Eyes: Pupils are equal, round, and reactive to light. Conjunctivae, EOM and lids are normal. Right eye exhibits no discharge. Left eye exhibits no discharge. No scleral icterus.  Neck: Normal  range of motion. Neck supple. No JVD present. No tracheal deviation present. No thyromegaly present.  Cardiovascular: Normal rate, regular rhythm, normal heart sounds and intact distal pulses. Exam reveals no gallop and no friction rub.  No murmur heard. Pulmonary/Chest: Effort normal and breath sounds normal. No stridor. No respiratory distress. He has no wheezes. He has no rales. He exhibits no tenderness.  Abdominal: Soft. Bowel sounds are normal. He exhibits no distension and no mass. There is no tenderness. There is no rebound and no guarding. No hernia. Hernia confirmed negative in the right inguinal area and confirmed negative in the left inguinal area.  Genitourinary: Testes normal and penis normal. Cremasteric reflex is present. Right testis shows no mass, no swelling and no tenderness. Right testis is descended. Cremasteric reflex is not absent on the right side. Left testis shows no mass, no swelling and no tenderness. Left testis is descended. Cremasteric reflex is not absent on the left side. Uncircumcised. No phimosis, paraphimosis, hypospadias, penile erythema or penile tenderness. No discharge found.  Musculoskeletal: Normal range of motion. He exhibits no edema, tenderness or deformity.  Lymphadenopathy:    He has no cervical adenopathy.  Neurological: He is alert and oriented to person, place, and time. He displays normal reflexes. No cranial nerve deficit or sensory deficit. He exhibits normal muscle tone. Coordination normal.  Skin: Skin is warm, dry and intact.  Capillary refill takes less than 2 seconds. No rash noted. He is not diaphoretic. No erythema. No pallor.  Psychiatric: He has a normal mood and affect. His speech is normal and behavior is normal. Judgment and thought content normal. Cognition and memory are normal.  Nursing note and vitals reviewed.   Results for orders placed or performed in visit on 08/12/18  Microscopic Examination  Result Value Ref Range   WBC, UA 0-5 0 - 5 /hpf   RBC, UA 0-2 0 - 2 /hpf   Epithelial Cells (non renal) 0-10 0 - 10 /hpf   Bacteria, UA None seen None seen/Few  Bayer DCA Hb A1c Waived  Result Value Ref Range   HB A1C (BAYER DCA - WAIVED) 5.6 <7.0 %  CBC with Differential/Platelet  Result Value Ref Range   WBC 7.5 3.4 - 10.8 x10E3/uL   RBC 5.18 4.14 - 5.80 x10E6/uL   Hemoglobin 16.1 13.0 - 17.7 g/dL   Hematocrit 40.9 81.1 - 51.0 %   MCV 90 79 - 97 fL   MCH 31.1 26.6 - 33.0 pg   MCHC 34.5 31.5 - 35.7 g/dL   RDW 91.4 78.2 - 95.6 %   Platelets 284 150 - 450 x10E3/uL   Neutrophils 46 Not Estab. %   Lymphs 38 Not Estab. %   Monocytes 6 Not Estab. %   Eos 9 Not Estab. %   Basos 1 Not Estab. %   Neutrophils Absolute 3.4 1.4 - 7.0 x10E3/uL   Lymphocytes Absolute 2.9 0.7 - 3.1 x10E3/uL   Monocytes Absolute 0.5 0.1 - 0.9 x10E3/uL   EOS (ABSOLUTE) 0.7 (H) 0.0 - 0.4 x10E3/uL   Basophils Absolute 0.1 0.0 - 0.2 x10E3/uL   Immature Granulocytes 0 Not Estab. %   Immature Grans (Abs) 0.0 0.0 - 0.1 x10E3/uL  Comprehensive metabolic panel  Result Value Ref Range   Glucose 91 65 - 99 mg/dL   BUN 16 6 - 24 mg/dL   Creatinine, Ser 2.13 0.76 - 1.27 mg/dL   GFR calc non Af Amer 100 >59 mL/min/1.73   GFR calc Af Amer 115 >  59 mL/min/1.73   BUN/Creatinine Ratio 18 9 - 20   Sodium 140 134 - 144 mmol/L   Potassium 4.2 3.5 - 5.2 mmol/L   Chloride 97 96 - 106 mmol/L   CO2 27 20 - 29 mmol/L   Calcium 9.9 8.7 - 10.2 mg/dL   Total Protein 7.9 6.0 - 8.5 g/dL   Albumin 5.0 3.5 - 5.5 g/dL   Globulin, Total 2.9 1.5 -  4.5 g/dL   Albumin/Globulin Ratio 1.7 1.2 - 2.2   Bilirubin Total 0.4 0.0 - 1.2 mg/dL   Alkaline Phosphatase 85 39 - 117 IU/L   AST 24 0 - 40 IU/L   ALT 24 0 - 44 IU/L  Iron and TIBC  Result Value Ref Range   Total Iron Binding Capacity 398 250 - 450 ug/dL   UIBC 086 578 - 469 ug/dL   Iron 629 38 - 528 ug/dL   Iron Saturation 30 15 - 55 %  Lipid Panel w/o Chol/HDL Ratio  Result Value Ref Range   Cholesterol, Total 193 100 - 199 mg/dL   Triglycerides 413 (H) 0 - 149 mg/dL   HDL 67 >24 mg/dL   VLDL Cholesterol Cal 32 5 - 40 mg/dL   LDL Calculated 94 0 - 99 mg/dL  PSA  Result Value Ref Range   Prostate Specific Ag, Serum 1.1 0.0 - 4.0 ng/mL  TSH  Result Value Ref Range   TSH 1.750 0.450 - 4.500 uIU/mL  UA/M w/rflx Culture, Routine  Result Value Ref Range   Specific Gravity, UA 1.015 1.005 - 1.030   pH, UA 5.5 5.0 - 7.5   Color, UA Yellow Yellow   Appearance Ur Clear Clear   Leukocytes, UA Negative Negative   Protein, UA Negative Negative/Trace   Glucose, UA Negative Negative   Ketones, UA Negative Negative   RBC, UA 1+ (A) Negative   Bilirubin, UA Negative Negative   Urobilinogen, Ur 0.2 0.2 - 1.0 mg/dL   Nitrite, UA Negative Negative   Microscopic Examination See below:   Ferritin  Result Value Ref Range   Ferritin 47 30 - 400 ng/mL      Assessment & Plan:   Problem List Items Addressed This Visit    None    Visit Diagnoses    Routine general medical examination at a health care facility    -  Primary   Vaccines up to date. Screening labs checked last visit and normal. Colonoscopy up to date. Continue diet and exercise. Call with any concerns.        LABORATORY TESTING:  Health maintenance labs ordered last visit and normal.   IMMUNIZATIONS:   - Tdap: Tetanus vaccination status reviewed: last tetanus booster within 10 years. - Influenza: Refused  SCREENING: - Colonoscopy: Up to date  Discussed with patient purpose of the colonoscopy is to detect colon  cancer at curable precancerous or early stages   PATIENT COUNSELING:    Sexuality: Discussed sexually transmitted diseases, partner selection, use of condoms, avoidance of unintended pregnancy  and contraceptive alternatives.   Advised to avoid cigarette smoking.  I discussed with the patient that most people either abstain from alcohol or drink within safe limits (<=14/week and <=4 drinks/occasion for males, <=7/weeks and <= 3 drinks/occasion for females) and that the risk for alcohol disorders and other health effects rises proportionally with the number of drinks per week and how often a drinker exceeds daily limits.  Discussed cessation/primary prevention of drug use and availability of treatment for  abuse.   Diet: Encouraged to adjust caloric intake to maintain  or achieve ideal body weight, to reduce intake of dietary saturated fat and total fat, to limit sodium intake by avoiding high sodium foods and not adding table salt, and to maintain adequate dietary potassium and calcium preferably from fresh fruits, vegetables, and low-fat dairy products.    stressed the importance of regular exercise  Injury prevention: Discussed safety belts, safety helmets, smoke detector, smoking near bedding or upholstery.   Dental health: Discussed importance of regular tooth brushing, flossing, and dental visits.   Follow up plan: NEXT PREVENTATIVE PHYSICAL DUE IN 1 YEAR. Return in about 1 year (around 09/17/2019) for Physcial.

## 2020-05-15 ENCOUNTER — Ambulatory Visit (INDEPENDENT_AMBULATORY_CARE_PROVIDER_SITE_OTHER): Payer: 59 | Admitting: Family Medicine

## 2020-05-15 ENCOUNTER — Encounter: Payer: Self-pay | Admitting: Family Medicine

## 2020-05-15 ENCOUNTER — Other Ambulatory Visit: Payer: Self-pay

## 2020-05-15 VITALS — BP 137/88 | HR 71 | Temp 98.1°F | Ht 68.5 in | Wt 180.2 lb

## 2020-05-15 DIAGNOSIS — Z0000 Encounter for general adult medical examination without abnormal findings: Secondary | ICD-10-CM

## 2020-05-15 LAB — URINALYSIS, ROUTINE W REFLEX MICROSCOPIC
Bilirubin, UA: NEGATIVE
Glucose, UA: NEGATIVE
Leukocytes,UA: NEGATIVE
Nitrite, UA: NEGATIVE
Protein,UA: NEGATIVE
Specific Gravity, UA: >1.03 — ABNORMAL HIGH (ref 1.005–1.030)
Urobilinogen, Ur: 0.2 mg/dL (ref 0.2–1.0)
pH, UA: 5 (ref 5.0–7.5)

## 2020-05-15 LAB — MICROSCOPIC EXAMINATION
Bacteria, UA: NONE SEEN
WBC, UA: NONE SEEN /hpf (ref 0–5)

## 2020-05-15 NOTE — Patient Instructions (Addendum)
Pataday- allergy eye drop   Health Maintenance, Male Adopting a healthy lifestyle and getting preventive care are important in promoting health and wellness. Ask your health care provider about:  The right schedule for you to have regular tests and exams.  Things you can do on your own to prevent diseases and keep yourself healthy. What should I know about diet, weight, and exercise? Eat a healthy diet   Eat a diet that includes plenty of vegetables, fruits, low-fat dairy products, and lean protein.  Do not eat a lot of foods that are high in solid fats, added sugars, or sodium. Maintain a healthy weight Body mass index (BMI) is a measurement that can be used to identify possible weight problems. It estimates body fat based on height and weight. Your health care provider can help determine your BMI and help you achieve or maintain a healthy weight. Get regular exercise Get regular exercise. This is one of the most important things you can do for your health. Most adults should:  Exercise for at least 150 minutes each week. The exercise should increase your heart rate and make you sweat (moderate-intensity exercise).  Do strengthening exercises at least twice a week. This is in addition to the moderate-intensity exercise.  Spend less time sitting. Even light physical activity can be beneficial. Watch cholesterol and blood lipids Have your blood tested for lipids and cholesterol at 52 years of age, then have this test every 5 years. You may need to have your cholesterol levels checked more often if:  Your lipid or cholesterol levels are high.  You are older than 52 years of age.  You are at high risk for heart disease. What should I know about cancer screening? Many types of cancers can be detected early and may often be prevented. Depending on your health history and family history, you may need to have cancer screening at various ages. This may include screening for:  Colorectal  cancer.  Prostate cancer.  Skin cancer.  Lung cancer. What should I know about heart disease, diabetes, and high blood pressure? Blood pressure and heart disease  High blood pressure causes heart disease and increases the risk of stroke. This is more likely to develop in people who have high blood pressure readings, are of African descent, or are overweight.  Talk with your health care provider about your target blood pressure readings.  Have your blood pressure checked: ? Every 3-5 years if you are 27-76 years of age. ? Every year if you are 51 years old or older.  If you are between the ages of 88 and 41 and are a current or former smoker, ask your health care provider if you should have a one-time screening for abdominal aortic aneurysm (AAA). Diabetes Have regular diabetes screenings. This checks your fasting blood sugar level. Have the screening done:  Once every three years after age 30 if you are at a normal weight and have a low risk for diabetes.  More often and at a younger age if you are overweight or have a high risk for diabetes. What should I know about preventing infection? Hepatitis B If you have a higher risk for hepatitis B, you should be screened for this virus. Talk with your health care provider to find out if you are at risk for hepatitis B infection. Hepatitis C Blood testing is recommended for:  Everyone born from [birth year] through 1963.  Anyone with known risk factors for hepatitis C. Sexually transmitted infections (STIs)  You should be screened each year for STIs, including gonorrhea and chlamydia, if: ? You are sexually active and are younger than 52 years of age. ? You are older than 52 years of age and your health care provider tells you that you are at risk for this type of infection. ? Your sexual activity has changed since you were last screened, and you are at increased risk for chlamydia or gonorrhea. Ask your health care provider if you are at  risk.  Ask your health care provider about whether you are at high risk for HIV. Your health care provider may recommend a prescription medicine to help prevent HIV infection. If you choose to take medicine to prevent HIV, you should first get tested for HIV. You should then be tested every 3 months for as long as you are taking the medicine. Follow these instructions at home: Lifestyle  Do not use any products that contain nicotine or tobacco, such as cigarettes, e-cigarettes, and chewing tobacco. If you need help quitting, ask your health care provider.  Do not use street drugs.  Do not share needles.  Ask your health care provider for help if you need support or information about quitting drugs. Alcohol use  Do not drink alcohol if your health care provider tells you not to drink.  If you drink alcohol: ? Limit how much you have to 0-2 drinks a day. ? Be aware of how much alcohol is in your drink. In the [place] one drink equals one 12 oz bottle of beer (355 mL), one 5 oz glass of wine (148 mL), or one 1 oz glass of hard liquor (44 mL). General instructions  Schedule regular health, dental, and eye exams.  Stay current with your vaccines.  Tell your health care provider if: ? You often feel depressed. ? You have ever been abused or do not feel safe at home. Summary  Adopting a healthy lifestyle and getting preventive care are important in promoting health and wellness.  Follow your health care provider's instructions about healthy diet, exercising, and getting tested or screened for diseases.  Follow your health care provider's instructions on monitoring your cholesterol and blood pressure. This information is not intended to replace advice given to you by your health care provider. Make sure you discuss any questions you have with your health care provider. Document Revised: 11/24/2018 Document Reviewed: 11/24/2018 Elsevier Patient Education  2020 [employer]  

## 2020-05-15 NOTE — Progress Notes (Signed)
BP 137/88 (BP Location: Left Arm, Patient Position: Sitting, Cuff Size: Normal)   Pulse 71   Temp 98.1 F (36.7 C) (Oral)   Ht 5' 8.5" (1.74 m)   Wt 180 lb 3.2 oz (81.7 kg)   SpO2 98%   BMI 27.00 kg/m    Subjective:    Patient ID: Garrett Barajas, male    DOB: 1966-10-10, 54 y.o.   MRN: [id number]  HPI: Garrett Barajas is a 54 y.o. male presenting on 05/15/2020 for comprehensive medical examination. Current medical complaints include:none  He currently lives with: wife Interim Problems from his last visit: no  Depression Screen done today and results listed below:  Depression screen [facility] 2/9 05/15/2020 09/16/2018 08/12/2018  Decreased Interest 0 0 0  Down, Depressed, Hopeless 1 0 0  PHQ - 2 Score 1 0 0  Altered sleeping 0 0 -  Tired, decreased energy 0 0 -  Change in appetite 0 0 -  Feeling bad or failure about yourself  0 0 -  Trouble concentrating 1 0 -  Moving slowly or fidgety/restless 0 0 -  Suicidal thoughts 0 0 -  PHQ-9 Score 2 0 -  Difficult doing work/chores Not difficult at all - -    Past Medical History:  Past Medical History:  Diagnosis Date  . Dizziness   . External hemorrhoids without mention of complication   . Iron deficiency anemia    a. Hgb 6.0 on 05/18/14- started on ferrous fumarate, patient is a [employer] Witness  . Refusal of blood transfusions as patient is Jehov[employer]'s Witness   . Third degree hemorrhoids   . Undiagnosed cardiac murmurs    HAD W/U DONE (EKG, ECHO) ALL NORMAL     Surgical History:  Past Surgical History:  Procedure Laterality Date  . ADENOIDECTOMY    . COLONOSCOPY    . COLONOSCOPY WITH PROPOFOL N/A 04/10/2016   Procedure: COLONOSCOPY WITH PROPOFOL;  Surgeon: Midge Minium, MD;  Location: [facility];  Service: Endoscopy;  Laterality: N/A;  . eye growth removal    . HEMORRHOID SURGERY N/A 06/09/2016   Procedure: HEMORRHOIDECTOMY;  Surgeon: Ricarda Frame, MD;  Location: [facility];  Service: General;  Laterality: N/A;  .  HEMORRHOID SURGERY N/A 07/15/2016   Procedure: HEMORRHOIDECTOMY;  Surgeon: Ricarda Frame, MD;  Location: [facility];  Service: General;  Laterality: N/A;  . RECTAL EXAM UNDER ANESTHESIA N/A 07/15/2016   Procedure: RECTAL EXAM UNDER ANESTHESIA;  Surgeon: Ricarda Frame, MD;  Location: [facility];  Service: General;  Laterality: N/A;  . TONSILLECTOMY      Medications:  Current Outpatient Medications on File Prior to Visit  Medication Sig  . Multiple Vitamins-Minerals (MENS 50+ MULTI VITAMIN/MIN PO) Take 1 tablet by mouth every other day.   No current facility-administered medications on file prior to visit.    Allergies:  No Known Allergies  Social History:  Social History   Socioeconomic History  . Marital status: Married    Spouse name: Not on file  . Number of children: Not on file  . Years of education: Not on file  . Highest education level: Not on file  Occupational History  . Not on file  Tobacco Use  . Smoking status: Never Smoker  . Smokeless tobacco: Never Used  Substance and Sexual Activity  . Alcohol use: Yes    Alcohol/week: 1.0 standard drinks    Types: 1 Glasses of wine per week    Comment: occ  . Drug use: No  . Sexual  activity: Yes  Other Topics Concern  . Not on file  Social History Narrative  . Not on file   Social Determinants of Health   Financial Resource Strain:   . Difficulty of Paying Living Expenses:   Food Insecurity:   . Worried About Programme researcher, broadcasting/film/video in the Last Year:   . Barista in the Last Year:   Transportation Needs:   . Freight forwarder (Medical):   Marland Kitchen Lack of Transportation (Non-Medical):   Physical Activity:   . Days of Exercise per Week:   . Minutes of Exercise per Session:   Stress:   . Feeling of Stress :   Social Connections:   . Frequency of Communication with Friends and Family:   . Frequency of Social Gatherings with Friends and Family:   . Attends Religious Services:   . Active Member of Clubs or  Organizations:   . Attends Banker Meetings:   Marland Kitchen Marital Status:   Intimate Partner Violence:   . Fear of Current or Ex-Partner:   . Emotionally Abused:   Marland Kitchen Physically Abused:   . Sexually Abused:    Social History   Tobacco Use  Smoking Status Never Smoker  Smokeless Tobacco Never Used   Social History   Substance and Sexual Activity  Alcohol Use Yes  . Alcohol/week: 1.0 standard drinks  . Types: 1 Glasses of wine per week   Comment: occ    Family History:  Family History  Problem Relation Age of Onset  . Diabetes Mother   . Hypertension Mother   . Arthritis Mother   . Diabetes Father   . Hypertension Father   . Arthritis Father   . Diabetes Maternal Grandmother   . Hypertension Maternal Grandmother   . Hypertension Paternal Grandfather     Past medical history, surgical history, medications, allergies, family history and social history reviewed with patient today and changes made to appropriate areas of the chart.   Review of Systems  Constitutional: Negative.   HENT: Negative.   Eyes: Negative.   Respiratory: Negative.   Cardiovascular: Negative.        Occasional chest pressure   Gastrointestinal: Negative.   Genitourinary: Negative.   Musculoskeletal: Negative.   Skin: Negative.   Neurological: Negative.   Endo/Heme/Allergies: Positive for environmental allergies. Negative for polydipsia. Does not bruise/bleed easily.  Psychiatric/Behavioral: Negative.     All other ROS negative except what is listed above and in the HPI.      Objective:    BP 137/88 (BP Location: Left Arm, Patient Position: Sitting, Cuff Size: Normal)   Pulse 71   Temp 98.1 F (36.7 C) (Oral)   Ht 5' 8.5" (1.74 m)   Wt 180 lb 3.2 oz (81.7 kg)   SpO2 98%   BMI 27.00 kg/m   Wt Readings from Last 3 Encounters:  05/15/20 180 lb 3.2 oz (81.7 kg)  09/16/18 181 lb 14.4 oz (82.5 kg)  08/12/18 181 lb 4 oz (82.2 kg)    Physical Exam Vitals and nursing note  reviewed.  Constitutional:      General: He is not in acute distress.    Appearance: Normal appearance. He is normal weight. He is not ill-appearing, toxic-appearing or diaphoretic.  HENT:     Head: Normocephalic and atraumatic.     Right Ear: Tympanic membrane, ear canal and external ear normal. There is no impacted cerumen.     Left Ear: Tympanic membrane, ear canal and external  ear normal. There is no impacted cerumen.     Nose: Nose normal. No congestion or rhinorrhea.     Mouth/Throat:     Mouth: Mucous membranes are moist.     Pharynx: Oropharynx is clear. No oropharyngeal exudate or posterior oropharyngeal erythema.  Eyes:     General: No scleral icterus.       Right eye: No discharge.        Left eye: No discharge.     Extraocular Movements: Extraocular movements intact.     Conjunctiva/sclera: Conjunctivae normal.     Pupils: Pupils are equal, round, and reactive to light.  Neck:     Vascular: No carotid bruit.  Cardiovascular:     Rate and Rhythm: Normal rate and regular rhythm.     Pulses: Normal pulses.     Heart sounds: No murmur. No friction rub. No gallop.   Pulmonary:     Effort: Pulmonary effort is normal. No respiratory distress.     Breath sounds: Normal breath sounds. No stridor. No wheezing, rhonchi or rales.  Chest:     Chest wall: No tenderness.  Abdominal:     General: Abdomen is flat. Bowel sounds are normal. There is no distension.     Palpations: Abdomen is soft. There is no mass.     Tenderness: There is no abdominal tenderness. There is no right CVA tenderness, left CVA tenderness, guarding or rebound.     Hernia: No hernia is present.  Genitourinary:    Comments: Genital exam deferred with shared decision making Musculoskeletal:        General: No swelling, tenderness, deformity or signs of injury.     Cervical back: Normal range of motion and neck supple. No rigidity. No muscular tenderness.     Right lower leg: No edema.     Left lower leg: No  edema.  Lymphadenopathy:     Cervical: No cervical adenopathy.  Skin:    General: Skin is warm and dry.     Capillary Refill: Capillary refill takes less than 2 seconds.     Coloration: Skin is not jaundiced or pale.     Findings: No bruising, erythema, lesion or rash.  Neurological:     General: No focal deficit present.     Mental Status: He is alert and oriented to person, place, and time.     Cranial Nerves: No cranial nerve deficit.     Sensory: No sensory deficit.     Motor: No weakness.     Coordination: Coordination normal.     Gait: Gait normal.     Deep Tendon Reflexes: Reflexes normal.  Psychiatric:        Mood and Affect: Mood normal.        Behavior: Behavior normal.        Thought Content: Thought content normal.        Judgment: Judgment normal.     Results for orders placed or performed in visit on 08/12/18  Microscopic Examination   BLD  Result Value Ref Range   WBC, UA 0-5 0 - 5 /hpf   RBC, UA 0-2 0 - 2 /hpf   Epithelial Cells (non renal) 0-10 0 - 10 /hpf   Bacteria, UA None seen None seen/Few  Bayer DCA Hb A1c Waived  Result Value Ref Range   HB A1C (BAYER DCA - WAIVED) 5.6 <7.0 %  CBC with Differential/Platelet  Result Value Ref Range   WBC 7.5 3.4 - 10.8 x10E3/uL  RBC 5.18 4.14 - 5.80 x10E6/uL   Hemoglobin 16.1 13.0 - 17.7 g/dL   Hematocrit 02.2 33.6 - 51.0 %   MCV 90 79 - 97 fL   MCH 31.1 26.6 - 33.0 pg   MCHC 34.5 31.5 - 35.7 g/dL   RDW 12.2 44.9 - 75.3 %   Platelets 284 150 - 450 x10E3/uL   Neutrophils 46 Not Estab. %   Lymphs 38 Not Estab. %   Monocytes 6 Not Estab. %   Eos 9 Not Estab. %   Basos 1 Not Estab. %   Neutrophils Absolute 3.4 1.4 - 7.0 x10E3/uL   Lymphocytes Absolute 2.9 0.7 - 3.1 x10E3/uL   Monocytes Absolute 0.5 0.1 - 0.9 x10E3/uL   EOS (ABSOLUTE) 0.7 (H) 0.0 - 0.4 x10E3/uL   Basophils Absolute 0.1 0.0 - 0.2 x10E3/uL   Immature Granulocytes 0 Not Estab. %   Immature Grans (Abs) 0.0 0.0 - 0.1 x10E3/uL  Comprehensive  metabolic panel  Result Value Ref Range   Glucose 91 65 - 99 mg/dL   BUN 16 6 - 24 mg/dL   Creatinine, Ser 0.05 0.76 - 1.27 mg/dL   GFR calc non Af Amer 100 >59 mL/min/1.73   GFR calc Af Amer 115 >59 mL/min/1.73   BUN/Creatinine Ratio 18 9 - 20   Sodium 140 134 - 144 mmol/L   Potassium 4.2 3.5 - 5.2 mmol/L   Chloride 97 96 - 106 mmol/L   CO2 27 20 - 29 mmol/L   Calcium 9.9 8.7 - 10.2 mg/dL   Total Protein 7.9 6.0 - 8.5 g/dL   Albumin 5.0 3.5 - 5.5 g/dL   Globulin, Total 2.9 1.5 - 4.5 g/dL   Albumin/Globulin Ratio 1.7 1.2 - 2.2   Bilirubin Total 0.4 0.0 - 1.2 mg/dL   Alkaline Phosphatase 85 39 - 117 IU/L   AST 24 0 - 40 IU/L   ALT 24 0 - 44 IU/L  Iron and TIBC  Result Value Ref Range   Total Iron Binding Capacity 398 250 - 450 ug/dL   UIBC 110 211 - 173 ug/dL   Iron 567 38 - 014 ug/dL   Iron Saturation 30 15 - 55 %  Lipid Panel w/o Chol/HDL Ratio  Result Value Ref Range   Cholesterol, Total 193 100 - 199 mg/dL   Triglycerides 103 (H) 0 - 149 mg/dL   HDL 67 >01 mg/dL   VLDL Cholesterol Cal 32 5 - 40 mg/dL   LDL Calculated 94 0 - 99 mg/dL  PSA  Result Value Ref Range   Prostate Specific Ag, Serum 1.1 0.0 - 4.0 ng/mL  TSH  Result Value Ref Range   TSH 1.750 0.450 - 4.500 uIU/mL  UA/M w/rflx Culture, Routine   Specimen: Blood   BLD  Result Value Ref Range   Specific Gravity, UA 1.015 1.005 - 1.030   pH, UA 5.5 5.0 - 7.5   Color, UA Yellow Yellow   Appearance Ur Clear Clear   Leukocytes, UA Negative Negative   Protein, UA Negative Negative/Trace   Glucose, UA Negative Negative   Ketones, UA Negative Negative   RBC, UA 1+ (A) Negative   Bilirubin, UA Negative Negative   Urobilinogen, Ur 0.2 0.2 - 1.0 mg/dL   Nitrite, UA Negative Negative   Microscopic Examination See below:   Ferritin  Result Value Ref Range   Ferritin 47 30 - 400 ng/mL      Assessment & Plan:   Problem List Items Addressed This Visit  None    Visit Diagnoses    Routine general medical  examination at a health care facility    -  Primary   Vaccines up to date. Screening labs checked today. Continue diet and exercise. Colonoscopy up to date. Continue to monitor.    Relevant Orders   CBC with Differential/Platelet   Comprehensive metabolic panel   Lipid Panel w/o Chol/HDL Ratio   PSA   TSH   Urinalysis, Routine w reflex microscopic   HIV Antibody (routine testing w rflx)       Discussed aspirin prophylaxis for myocardial infarction prevention and decision was it was not indicated  LABORATORY TESTING:  Health maintenance labs ordered today as discussed above.   The natural history of prostate cancer and ongoing controversy regarding screening and potential treatment outcomes of prostate cancer has been discussed with the patient. The meaning of a false positive PSA and a false negative PSA has been discussed. He indicates understanding of the limitations of this screening test and wishes to proceed with screening PSA testing.   IMMUNIZATIONS:   - Tdap: Tetanus vaccination status reviewed: last tetanus booster within 10 years. - Influenza: Postponed to flu season - Pneumovax: Not applicable  SCREENING: - Colonoscopy: Up to date  Discussed with patient purpose of the colonoscopy is to detect colon cancer at curable precancerous or early stages   PATIENT COUNSELING:    Sexuality: Discussed sexually transmitted diseases, partner selection, use of condoms, avoidance of unintended pregnancy  and contraceptive alternatives.   Advised to avoid cigarette smoking.  I discussed with the patient that most people either abstain from alcohol or drink within safe limits (<=14/week and <=4 drinks/occasion for males, <=7/weeks and <= 3 drinks/occasion for females) and that the risk for alcohol disorders and other health effects rises proportionally with the number of drinks per week and how often a drinker exceeds daily limits.  Discussed cessation/primary prevention of drug use  and availability of treatment for abuse.   Diet: Encouraged to adjust caloric intake to maintain  or achieve ideal body weight, to reduce intake of dietary saturated fat and total fat, to limit sodium intake by avoiding high sodium foods and not adding table salt, and to maintain adequate dietary potassium and calcium preferably from fresh fruits, vegetables, and low-fat dairy products.    stressed the importance of regular exercise  Injury prevention: Discussed safety belts, safety helmets, smoke detector, smoking near bedding or upholstery.   Dental health: Discussed importance of regular tooth brushing, flossing, and dental visits.   Follow up plan: NEXT PREVENTATIVE PHYSICAL DUE IN 1 YEAR. Return in about 1 year (around 05/15/2021) for physical.

## 2020-05-16 LAB — CBC WITH DIFFERENTIAL/PLATELET
Basophils Absolute: 0.1 10*3/uL (ref 0.0–0.2)
Basos: 1 %
EOS (ABSOLUTE): 0.5 10*3/uL — ABNORMAL HIGH (ref 0.0–0.4)
Eos: 8 %
Hematocrit: 45.7 % (ref 37.5–51.0)
Hemoglobin: 15.6 g/dL (ref 13.0–17.7)
Immature Grans (Abs): 0 10*3/uL (ref 0.0–0.1)
Immature Granulocytes: 0 %
Lymphocytes Absolute: 2 10*3/uL (ref 0.7–3.1)
Lymphs: 34 %
MCH: 31.1 pg (ref 26.6–33.0)
MCHC: 34.1 g/dL (ref 31.5–35.7)
MCV: 91 fL (ref 79–97)
Monocytes Absolute: 0.4 10*3/uL (ref 0.1–0.9)
Monocytes: 7 %
Neutrophils Absolute: 2.9 10*3/uL (ref 1.4–7.0)
Neutrophils: 50 %
Platelets: 256 10*3/uL (ref 150–450)
RBC: 5.02 x10E6/uL (ref 4.14–5.80)
RDW: 13.3 % (ref 11.6–15.4)
WBC: 5.9 10*3/uL (ref 3.4–10.8)

## 2020-05-16 LAB — COMPREHENSIVE METABOLIC PANEL
ALT: 29 IU/L (ref 0–44)
AST: 33 IU/L (ref 0–40)
Albumin/Globulin Ratio: 1.8 (ref 1.2–2.2)
Albumin: 4.9 g/dL (ref 3.8–4.9)
Alkaline Phosphatase: 95 IU/L (ref 48–121)
BUN/Creatinine Ratio: 15 (ref 9–20)
BUN: 14 mg/dL (ref 6–24)
Bilirubin Total: 0.6 mg/dL (ref 0.0–1.2)
CO2: 25 mmol/L (ref 20–29)
Calcium: 9.7 mg/dL (ref 8.7–10.2)
Chloride: 100 mmol/L (ref 96–106)
Creatinine, Ser: 0.96 mg/dL (ref 0.76–1.27)
GFR calc Af Amer: 105 mL/min/{1.73_m2} (ref >59)
GFR calc non Af Amer: 91 mL/min/{1.73_m2} (ref >59)
Globulin, Total: 2.8 g/dL (ref 1.5–4.5)
Glucose: 89 mg/dL (ref 65–99)
Potassium: 3.8 mmol/L (ref 3.5–5.2)
Sodium: 139 mmol/L (ref 134–144)
Total Protein: 7.7 g/dL (ref 6.0–8.5)

## 2020-05-16 LAB — HIV ANTIBODY (ROUTINE TESTING W REFLEX): HIV Screen 4th Generation wRfx: NONREACTIVE

## 2020-05-16 LAB — LIPID PANEL W/O CHOL/HDL RATIO
Cholesterol, Total: 170 mg/dL (ref 100–199)
HDL: 70 mg/dL (ref >39)
LDL Chol Calc (NIH): 83 mg/dL (ref 0–99)
Triglycerides: 96 mg/dL (ref 0–149)
VLDL Cholesterol Cal: 17 mg/dL (ref 5–40)

## 2020-05-16 LAB — TSH: TSH: 1.53 u[IU]/mL (ref 0.450–4.500)

## 2020-05-16 LAB — PSA: Prostate Specific Ag, Serum: 0.4 ng/mL (ref 0.0–4.0)

## 2020-05-17 ENCOUNTER — Encounter: Payer: Self-pay | Admitting: Family Medicine

## 2021-05-16 ENCOUNTER — Ambulatory Visit (INDEPENDENT_AMBULATORY_CARE_PROVIDER_SITE_OTHER): Payer: 59 | Admitting: Family Medicine

## 2021-05-16 ENCOUNTER — Encounter: Payer: Self-pay | Admitting: Family Medicine

## 2021-05-16 ENCOUNTER — Other Ambulatory Visit: Payer: Self-pay

## 2021-05-16 VITALS — BP 133/87 | HR 78 | Temp 98.4°F | Ht 68.5 in | Wt 184.2 lb

## 2021-05-16 DIAGNOSIS — D509 Iron deficiency anemia, unspecified: Secondary | ICD-10-CM

## 2021-05-16 DIAGNOSIS — Z0000 Encounter for general adult medical examination without abnormal findings: Secondary | ICD-10-CM

## 2021-05-16 LAB — URINALYSIS, ROUTINE W REFLEX MICROSCOPIC
Bilirubin, UA: NEGATIVE
Glucose, UA: NEGATIVE
Ketones, UA: NEGATIVE
Leukocytes,UA: NEGATIVE
Nitrite, UA: NEGATIVE
Protein,UA: NEGATIVE
Specific Gravity, UA: >1.03 — ABNORMAL HIGH (ref 1.005–1.030)
Urobilinogen, Ur: 0.2 mg/dL (ref 0.2–1.0)
pH, UA: 5.5 (ref 5.0–7.5)

## 2021-05-16 LAB — MICROSCOPIC EXAMINATION
Bacteria, UA: NONE SEEN
Epithelial Cells (non renal): NONE SEEN /hpf (ref 0–10)
WBC, UA: NONE SEEN /hpf (ref 0–5)

## 2021-05-16 NOTE — Patient Instructions (Signed)

## 2021-05-16 NOTE — Assessment & Plan Note (Signed)
Rechecking labs today. Await results. Treat as needed.  °

## 2021-05-16 NOTE — Progress Notes (Signed)
BP 133/87   Pulse 78   Temp 98.4 F (36.9 C)   Ht 5' 8.5" (1.74 m)   Wt 184 lb 3.2 oz (83.6 kg)   SpO2 100%   BMI 27.60 kg/m    Subjective:    Patient ID: Garrett Barajas, male    DOB: Jan 30, 1966, 55 y.o.   MRN: [id number]  HPI: Garrett Barajas is a 55 y.o. male presenting on 05/16/2021 for comprehensive medical examination. Current medical complaints include:none  He currently lives with: wife Interim Problems from his last visit: no  Depression Screen done today and results listed below:  Depression screen [facility] 2/9 05/16/2021 05/15/2020 09/16/2018 08/12/2018  Decreased Interest 0 0 0 0  Down, Depressed, Hopeless 0 1 0 0  PHQ - 2 Score 0 1 0 0  Altered sleeping - 0 0 -  Tired, decreased energy - 0 0 -  Change in appetite - 0 0 -  Feeling bad or failure about yourself  - 0 0 -  Trouble concentrating - 1 0 -  Moving slowly or fidgety/restless - 0 0 -  Suicidal thoughts - 0 0 -  PHQ-9 Score - 2 0 -  Difficult doing work/chores - Not difficult at all - -    Past Medical History:  Past Medical History:  Diagnosis Date  . Dizziness   . External hemorrhoids without mention of complication   . Iron deficiency anemia    a. Hgb 6.0 on 05/18/14- started on ferrous fumarate, patient is a [employer]  . Refusal of blood transfusions as patient is [employer] Witness   . Third degree hemorrhoids   . Undiagnosed cardiac murmurs    HAD W/U DONE (EKG, ECHO) ALL NORMAL     Surgical History:  Past Surgical History:  Procedure Laterality Date  . ADENOIDECTOMY    . COLONOSCOPY    . COLONOSCOPY WITH PROPOFOL N/A 04/10/2016   Procedure: COLONOSCOPY WITH PROPOFOL;  Surgeon: Midge Minium, MD;  Location: [facility];  Service: Endoscopy;  Laterality: N/A;  . eye growth removal    . HEMORRHOID SURGERY N/A 06/09/2016   Procedure: HEMORRHOIDECTOMY;  Surgeon: Ricarda Frame, MD;  Location: [facility];  Service: General;  Laterality: N/A;  . HEMORRHOID SURGERY N/A 07/15/2016   Procedure:  HEMORRHOIDECTOMY;  Surgeon: Ricarda Frame, MD;  Location: [facility];  Service: General;  Laterality: N/A;  . RECTAL EXAM UNDER ANESTHESIA N/A 07/15/2016   Procedure: RECTAL EXAM UNDER ANESTHESIA;  Surgeon: Ricarda Frame, MD;  Location: [facility];  Service: General;  Laterality: N/A;  . TONSILLECTOMY      Medications:  No current outpatient medications on file prior to visit.   No current facility-administered medications on file prior to visit.    Allergies:  No Known Allergies  Social History:  Social History   Socioeconomic History  . Marital status: Married    Spouse name: Not on file  . Number of children: Not on file  . Years of education: Not on file  . Highest education level: Not on file  Occupational History  . Not on file  Tobacco Use  . Smoking status: Never Smoker  . Smokeless tobacco: Never Used  Vaping Use  . Vaping Use: Never used  Substance and Sexual Activity  . Alcohol use: Yes    Alcohol/week: 1.0 standard drink    Types: 1 Glasses of wine per week    Comment: occ  . Drug use: No  . Sexual activity: Yes  Other Topics Concern  .  Not on file  Social History Narrative  . Not on file   Social Determinants of Health   Financial Resource Strain: Not on file  Food Insecurity: Not on file  Transportation Needs: Not on file  Physical Activity: Not on file  Stress: Not on file  Social Connections: Not on file  Intimate Partner Violence: Not on file   Social History   Tobacco Use  Smoking Status Never Smoker  Smokeless Tobacco Never Used   Social History   Substance and Sexual Activity  Alcohol Use Yes  . Alcohol/week: 1.0 standard drink  . Types: 1 Glasses of wine per week   Comment: occ    Family History:  Family History  Problem Relation Age of Onset  . Diabetes Mother   . Hypertension Mother   . Arthritis Mother   . Diabetes Father   . Hypertension Father   . Arthritis Father   . Diabetes Maternal Grandmother   . Hypertension  Maternal Grandmother   . Hypertension Paternal Grandfather     Past medical history, surgical history, medications, allergies, family history and social history reviewed with patient today and changes made to appropriate areas of the chart.   Review of Systems  Constitutional: Negative.   HENT: Negative.   Eyes: Negative.   Respiratory: Negative.   Cardiovascular: Negative.   Gastrointestinal: Negative.   Genitourinary: Negative.   Musculoskeletal: Negative.   Skin: Negative.   Neurological: Negative.   Endo/Heme/Allergies: Negative.   Psychiatric/Behavioral: Negative.    All other ROS negative except what is listed above and in the HPI.      Objective:    BP 133/87   Pulse 78   Temp 98.4 F (36.9 C)   Ht 5' 8.5" (1.74 m)   Wt 184 lb 3.2 oz (83.6 kg)   SpO2 100%   BMI 27.60 kg/m   Wt Readings from Last 3 Encounters:  05/16/21 184 lb 3.2 oz (83.6 kg)  05/15/20 180 lb 3.2 oz (81.7 kg)  09/16/18 181 lb 14.4 oz (82.5 kg)    Physical Exam Vitals and nursing note reviewed.  Constitutional:      General: He is not in acute distress.    Appearance: Normal appearance. He is obese. He is not ill-appearing, toxic-appearing or diaphoretic.  HENT:     Head: Normocephalic and atraumatic.     Right Ear: Tympanic membrane, ear canal and external ear normal. There is no impacted cerumen.     Left Ear: Tympanic membrane, ear canal and external ear normal. There is no impacted cerumen.     Nose: Nose normal. No congestion or rhinorrhea.     Mouth/Throat:     Mouth: Mucous membranes are moist.     Pharynx: Oropharynx is clear. No oropharyngeal exudate or posterior oropharyngeal erythema.  Eyes:     General: No scleral icterus.       Right eye: No discharge.        Left eye: No discharge.     Extraocular Movements: Extraocular movements intact.     Conjunctiva/sclera: Conjunctivae normal.     Pupils: Pupils are equal, round, and reactive to light.  Neck:     Vascular: No  carotid bruit.  Cardiovascular:     Rate and Rhythm: Normal rate and regular rhythm.     Pulses: Normal pulses.     Heart sounds: No murmur heard. No friction rub. No gallop.   Pulmonary:     Effort: Pulmonary effort is normal. No respiratory distress.  Breath sounds: Normal breath sounds. No stridor. No wheezing, rhonchi or rales.  Chest:     Chest wall: No tenderness.  Abdominal:     General: Abdomen is flat. Bowel sounds are normal. There is no distension.     Palpations: Abdomen is soft. There is no mass.     Tenderness: There is no abdominal tenderness. There is no right CVA tenderness, left CVA tenderness, guarding or rebound.     Hernia: No hernia is present.  Genitourinary:    Comments: Genital exam deferred with shared decision making Musculoskeletal:        General: No swelling, tenderness, deformity or signs of injury.     Cervical back: Normal range of motion and neck supple. No rigidity. No muscular tenderness.     Right lower leg: No edema.     Left lower leg: No edema.  Lymphadenopathy:     Cervical: No cervical adenopathy.  Skin:    General: Skin is warm and dry.     Capillary Refill: Capillary refill takes less than 2 seconds.     Coloration: Skin is not jaundiced or pale.     Findings: No bruising, erythema, lesion or rash.  Neurological:     General: No focal deficit present.     Mental Status: He is alert and oriented to person, place, and time.     Cranial Nerves: No cranial nerve deficit.     Sensory: No sensory deficit.     Motor: No weakness.     Coordination: Coordination normal.     Gait: Gait normal.     Deep Tendon Reflexes: Reflexes normal.  Psychiatric:        Mood and Affect: Mood normal.        Behavior: Behavior normal.        Thought Content: Thought content normal.        Judgment: Judgment normal.     Results for orders placed or performed in visit on 05/15/20  Microscopic Examination   URINE  Result Value Ref Range   WBC, UA  None seen 0 - 5 /hpf   RBC 3-10 (A) 0 - 2 /hpf   Epithelial Cells (non renal) 0-10 0 - 10 /hpf   Bacteria, UA None seen None seen/Few  CBC with Differential/Platelet  Result Value Ref Range   WBC 5.9 3.4 - 10.8 x10E3/uL   RBC 5.02 4.14 - 5.80 x10E6/uL   Hemoglobin 15.6 13.0 - 17.7 g/dL   Hematocrit 16.145.7 09.637.5 - 51.0 %   MCV 91 79 - 97 fL   MCH 31.1 26.6 - 33.0 pg   MCHC 34.1 31.5 - 35.7 g/dL   RDW 04.513.3 40.911.6 - 81.115.4 %   Platelets 256 150 - 450 x10E3/uL   Neutrophils 50 Not Estab. %   Lymphs 34 Not Estab. %   Monocytes 7 Not Estab. %   Eos 8 Not Estab. %   Basos 1 Not Estab. %   Neutrophils Absolute 2.9 1.4 - 7.0 x10E3/uL   Lymphocytes Absolute 2.0 0.7 - 3.1 x10E3/uL   Monocytes Absolute 0.4 0.1 - 0.9 x10E3/uL   EOS (ABSOLUTE) 0.5 (H) 0.0 - 0.4 x10E3/uL   Basophils Absolute 0.1 0.0 - 0.2 x10E3/uL   Immature Granulocytes 0 Not Estab. %   Immature Grans (Abs) 0.0 0.0 - 0.1 x10E3/uL  Comprehensive metabolic panel  Result Value Ref Range   Glucose 89 65 - 99 mg/dL   BUN 14 6 - 24 mg/dL   Creatinine, Ser 9.140.96  0.76 - 1.27 mg/dL   GFR calc non Af Amer 91 >59 mL/min/1.73   GFR calc Af Amer 105 >59 mL/min/1.73   BUN/Creatinine Ratio 15 9 - 20   Sodium 139 134 - 144 mmol/L   Potassium 3.8 3.5 - 5.2 mmol/L   Chloride 100 96 - 106 mmol/L   CO2 25 20 - 29 mmol/L   Calcium 9.7 8.7 - 10.2 mg/dL   Total Protein 7.7 6.0 - 8.5 g/dL   Albumin 4.9 3.8 - 4.9 g/dL   Globulin, Total 2.8 1.5 - 4.5 g/dL   Albumin/Globulin Ratio 1.8 1.2 - 2.2   Bilirubin Total 0.6 0.0 - 1.2 mg/dL   Alkaline Phosphatase 95 48 - 121 IU/L   AST 33 0 - 40 IU/L   ALT 29 0 - 44 IU/L  Lipid Panel w/o Chol/HDL Ratio  Result Value Ref Range   Cholesterol, Total 170 100 - 199 mg/dL   Triglycerides 96 0 - 149 mg/dL   HDL 70 >00 mg/dL   VLDL Cholesterol Cal 17 5 - 40 mg/dL   LDL Chol Calc (NIH) 83 0 - 99 mg/dL  PSA  Result Value Ref Range   Prostate Specific Ag, Serum 0.4 0.0 - 4.0 ng/mL  TSH  Result Value Ref Range    TSH 1.530 0.450 - 4.500 uIU/mL  Urinalysis, Routine w reflex microscopic  Result Value Ref Range   Specific Gravity, UA >1.030 (H) 1.005 - 1.030   pH, UA 5.0 5.0 - 7.5   Color, UA Yellow Yellow   Appearance Ur Clear Clear   Leukocytes,UA Negative Negative   Protein,UA Negative Negative/Trace   Glucose, UA Negative Negative   Ketones, UA Trace (A) Negative   RBC, UA 1+ (A) Negative   Bilirubin, UA Negative Negative   Urobilinogen, Ur 0.2 0.2 - 1.0 mg/dL   Nitrite, UA Negative Negative   Microscopic Examination See below:   HIV Antibody (routine testing w rflx)  Result Value Ref Range   HIV Screen 4th Generation wRfx Non Reactive Non Reactive      Assessment & Plan:   Problem List Items Addressed This Visit      Other   Iron deficiency anemia    Rechecking labs today. Await results. Treat as needed.       Relevant Orders   CBC with Differential/Platelet   Iron and TIBC   Ferritin    Other Visit Diagnoses    Routine general medical examination at a health care facility    -  Primary   Vaccines up to date. Screening labs checked today. Colonoscopy up to date. Continue diet and exercise. Call with any concerns.    Relevant Orders   Comprehensive metabolic panel   CBC with Differential/Platelet   Lipid Panel w/o Chol/HDL Ratio   PSA   TSH   Urinalysis, Routine w reflex microscopic   Hepatitis C Antibody       LABORATORY TESTING:  Health maintenance labs ordered today as discussed above.   The natural history of prostate cancer and ongoing controversy regarding screening and potential treatment outcomes of prostate cancer has been discussed with the patient. The meaning of a false positive PSA and a false negative PSA has been discussed. He indicates understanding of the limitations of this screening test and wishes to proceed with screening PSA testing.   IMMUNIZATIONS:   - Tdap: Tetanus vaccination status reviewed: last tetanus booster within 10 years. -  Influenza: Postponed to flu season - Pneumovax: Not applicable -  COVID: Up to date - Shingrix vaccine: Up to date  SCREENING: - Colonoscopy: Up to date  Discussed with patient purpose of the colonoscopy is to detect colon cancer at curable precancerous or early stages   PATIENT COUNSELING:    Sexuality: Discussed sexually transmitted diseases, partner selection, use of condoms, avoidance of unintended pregnancy  and contraceptive alternatives.   Advised to avoid cigarette smoking.  I discussed with the patient that most people either abstain from alcohol or drink within safe limits (<=14/week and <=4 drinks/occasion for males, <=7/weeks and <= 3 drinks/occasion for females) and that the risk for alcohol disorders and other health effects rises proportionally with the number of drinks per week and how often a drinker exceeds daily limits.  Discussed cessation/primary prevention of drug use and availability of treatment for abuse.   Diet: Encouraged to adjust caloric intake to maintain  or achieve ideal body weight, to reduce intake of dietary saturated fat and total fat, to limit sodium intake by avoiding high sodium foods and not adding table salt, and to maintain adequate dietary potassium and calcium preferably from fresh fruits, vegetables, and low-fat dairy products.    stressed the importance of regular exercise  Injury prevention: Discussed safety belts, safety helmets, smoke detector, smoking near bedding or upholstery.   Dental health: Discussed importance of regular tooth brushing, flossing, and dental visits.   Follow up plan: NEXT PREVENTATIVE PHYSICAL DUE IN 1 YEAR. Return in about 1 year (around 05/16/2022) for physical.

## 2021-05-17 ENCOUNTER — Encounter: Payer: Self-pay | Admitting: Family Medicine

## 2021-05-17 LAB — COMPREHENSIVE METABOLIC PANEL
ALT: 18 IU/L (ref 0–44)
AST: 24 IU/L (ref 0–40)
Albumin/Globulin Ratio: 1.7 (ref 1.2–2.2)
Albumin: 5 g/dL — ABNORMAL HIGH (ref 3.8–4.9)
Alkaline Phosphatase: 96 IU/L (ref 44–121)
BUN/Creatinine Ratio: 15 (ref 9–20)
BUN: 15 mg/dL (ref 6–24)
Bilirubin Total: 0.8 mg/dL (ref 0.0–1.2)
CO2: 27 mmol/L (ref 20–29)
Calcium: 9.8 mg/dL (ref 8.7–10.2)
Chloride: 99 mmol/L (ref 96–106)
Creatinine, Ser: 1.01 mg/dL (ref 0.76–1.27)
Globulin, Total: 3 g/dL (ref 1.5–4.5)
Glucose: 91 mg/dL (ref 65–99)
Potassium: 4.5 mmol/L (ref 3.5–5.2)
Sodium: 141 mmol/L (ref 134–144)
Total Protein: 8 g/dL (ref 6.0–8.5)
eGFR: 89 mL/min/{1.73_m2} (ref >59)

## 2021-05-17 LAB — CBC WITH DIFFERENTIAL/PLATELET
Basophils Absolute: 0.1 10*3/uL (ref 0.0–0.2)
Basos: 2 %
EOS (ABSOLUTE): 0.8 10*3/uL — ABNORMAL HIGH (ref 0.0–0.4)
Eos: 15 %
Hematocrit: 47.6 % (ref 37.5–51.0)
Hemoglobin: 16.3 g/dL (ref 13.0–17.7)
Immature Grans (Abs): 0 10*3/uL (ref 0.0–0.1)
Immature Granulocytes: 0 %
Lymphocytes Absolute: 1.9 10*3/uL (ref 0.7–3.1)
Lymphs: 36 %
MCH: 31.2 pg (ref 26.6–33.0)
MCHC: 34.2 g/dL (ref 31.5–35.7)
MCV: 91 fL (ref 79–97)
Monocytes Absolute: 0.4 10*3/uL (ref 0.1–0.9)
Monocytes: 7 %
Neutrophils Absolute: 2 10*3/uL (ref 1.4–7.0)
Neutrophils: 40 %
Platelets: 304 10*3/uL (ref 150–450)
RBC: 5.23 x10E6/uL (ref 4.14–5.80)
RDW: 12.9 % (ref 11.6–15.4)
WBC: 5.2 10*3/uL (ref 3.4–10.8)

## 2021-05-17 LAB — TSH: TSH: 1.93 u[IU]/mL (ref 0.450–4.500)

## 2021-05-17 LAB — LIPID PANEL W/O CHOL/HDL RATIO
Cholesterol, Total: 193 mg/dL (ref 100–199)
HDL: 72 mg/dL (ref >39)
LDL Chol Calc (NIH): 106 mg/dL — ABNORMAL HIGH (ref 0–99)
Triglycerides: 84 mg/dL (ref 0–149)
VLDL Cholesterol Cal: 15 mg/dL (ref 5–40)

## 2021-05-17 LAB — FERRITIN: Ferritin: 70 ng/mL (ref 30–400)

## 2021-05-17 LAB — IRON AND TIBC
Iron Saturation: 35 % (ref 15–55)
Iron: 140 ug/dL (ref 38–169)
Total Iron Binding Capacity: 402 ug/dL (ref 250–450)
UIBC: 262 ug/dL (ref 111–343)

## 2021-05-17 LAB — PSA: Prostate Specific Ag, Serum: 0.4 ng/mL (ref 0.0–4.0)

## 2021-05-17 LAB — HEPATITIS C ANTIBODY: Hep C Virus Ab: 0.1 s/co ratio (ref 0.0–0.9)

## 2021-12-12 ENCOUNTER — Encounter: Payer: Self-pay | Admitting: Oncology

## 2022-01-20 ENCOUNTER — Encounter: Payer: Self-pay | Admitting: Family Medicine

## 2022-01-21 ENCOUNTER — Encounter: Payer: Self-pay | Admitting: Oncology

## 2022-04-17 ENCOUNTER — Telehealth: Payer: Self-pay | Admitting: Family Medicine

## 2022-04-17 DIAGNOSIS — Z0000 Encounter for general adult medical examination without abnormal findings: Secondary | ICD-10-CM

## 2022-04-17 NOTE — Telephone Encounter (Signed)
Copied from CRM [id number]. Topic: General - Other ?>> Apr 17, 2022  3:28 PM Jaquita Rector A wrote: ?Reason for CRM: Patient wife called in and he was scheduled for appointment on a Friday 05/30/22 but will like to come in early and do labs asking if Dr Laural Benes can please place lab orders so he can come in that morning early and have it done since appointment is in the afternoon. Please advise and call patient ?

## 2022-04-18 NOTE — Telephone Encounter (Signed)
Routing to provider to advise. Patient has CPE scheduled for that afternoon at 1:00 pm ? ?

## 2022-04-22 NOTE — Telephone Encounter (Signed)
Orders in 

## 2022-04-22 NOTE — Addendum Note (Signed)
Addended by: Dorcas Carrow on: 04/22/2022 01:16 PM ? ? Modules accepted: Orders ? ?

## 2022-05-22 ENCOUNTER — Encounter: Payer: 59 | Admitting: Family Medicine

## 2022-05-27 ENCOUNTER — Encounter: Payer: Self-pay | Admitting: Family Medicine

## 2022-05-30 ENCOUNTER — Ambulatory Visit (INDEPENDENT_AMBULATORY_CARE_PROVIDER_SITE_OTHER): Payer: 59 | Admitting: Family Medicine

## 2022-05-30 ENCOUNTER — Encounter: Payer: Self-pay | Admitting: Family Medicine

## 2022-05-30 ENCOUNTER — Encounter: Payer: Self-pay | Admitting: Oncology

## 2022-05-30 ENCOUNTER — Other Ambulatory Visit: Payer: 59

## 2022-05-30 VITALS — BP 121/79 | HR 67 | Temp 97.9°F | Ht 70.0 in | Wt 184.2 lb

## 2022-05-30 DIAGNOSIS — Z0000 Encounter for general adult medical examination without abnormal findings: Secondary | ICD-10-CM

## 2022-05-30 LAB — URINALYSIS, ROUTINE W REFLEX MICROSCOPIC
Bilirubin, UA: NEGATIVE
Glucose, UA: NEGATIVE
Ketones, UA: NEGATIVE
Leukocytes,UA: NEGATIVE
Nitrite, UA: NEGATIVE
Protein,UA: NEGATIVE
Specific Gravity, UA: 1.01 (ref 1.005–1.030)
Urobilinogen, Ur: 0.2 mg/dL (ref 0.2–1.0)
pH, UA: 6.5 (ref 5.0–7.5)

## 2022-05-30 LAB — MICROSCOPIC EXAMINATION
Bacteria, UA: NONE SEEN
Epithelial Cells (non renal): NONE SEEN /hpf (ref 0–10)
WBC, UA: NONE SEEN /hpf (ref 0–5)

## 2022-05-30 NOTE — Progress Notes (Signed)
BP 121/79   Pulse 67   Temp 97.9 F (36.6 C)   Ht 5' 10"  (1.778 m)   Wt 184 lb 3.2 oz (83.6 kg)   SpO2 97%   BMI 26.43 kg/m    Subjective:    Patient ID: Garrett Barajas, male    DOB: October 01, 1966, 56 y.o.   MRN: [id number]  HPI: Garrett Barajas is a 56 y.o. male presenting on 05/30/2022 for comprehensive medical examination. Current medical complaints include:none  He currently lives with: wife Interim Problems from his last visit: no  Depression Screen done today and results listed below:     05/30/2022    1:17 PM 05/16/2021    8:08 AM 05/15/2020    1:17 PM 09/16/2018    9:19 AM 08/12/2018    3:05 PM  Depression screen PHQ 2/9  Decreased Interest 2 0 0 0 0  Down, Depressed, Hopeless 0 0 1 0 0  PHQ - 2 Score 2 0 1 0 0  Altered sleeping 2  0 0   Tired, decreased energy 0  0 0   Change in appetite 0  0 0   Feeling bad or failure about yourself  0  0 0   Trouble concentrating 0  1 0   Moving slowly or fidgety/restless 0  0 0   Suicidal thoughts 0  0 0   PHQ-9 Score 4  2 0   Difficult doing work/chores Not difficult at all  Not difficult at all      Past Medical History:  Past Medical History:  Diagnosis Date   Dizziness    External hemorrhoids without mention of complication    Iron deficiency anemia    a. Hgb 6.0 on 05/18/14- started on ferrous fumarate, patient is a Sales promotion account executive Witness   Refusal of blood transfusions as patient is Sales promotion account executive Witness    Third degree hemorrhoids    Undiagnosed cardiac murmurs    HAD W/U DONE (EKG, ECHO) ALL NORMAL     Surgical History:  Past Surgical History:  Procedure Laterality Date   ADENOIDECTOMY     COLONOSCOPY     COLONOSCOPY WITH PROPOFOL N/A 04/10/2016   Procedure: COLONOSCOPY WITH PROPOFOL;  Surgeon: Lucilla Lame, MD;  Location: [place];  Service: Endoscopy;  Laterality: N/A;   eye growth removal     HEMORRHOID SURGERY N/A 06/09/2016   Procedure: HEMORRHOIDECTOMY;  Surgeon: Clayburn Pert, MD;  Location: [facility];   Service: General;  Laterality: N/A;   HEMORRHOID SURGERY N/A 07/15/2016   Procedure: HEMORRHOIDECTOMY;  Surgeon: Clayburn Pert, MD;  Location: [facility];  Service: General;  Laterality: N/A;   RECTAL EXAM UNDER ANESTHESIA N/A 07/15/2016   Procedure: RECTAL EXAM UNDER ANESTHESIA;  Surgeon: Clayburn Pert, MD;  Location: [facility];  Service: General;  Laterality: N/A;   TONSILLECTOMY      Medications:  No current outpatient medications on file prior to visit.   No current facility-administered medications on file prior to visit.    Allergies:  No Known Allergies  Social History:  Social History   Socioeconomic History   Marital status: Married    Spouse name: Not on file   Number of children: Not on file   Years of education: Not on file   Highest education level: Not on file  Occupational History   Not on file  Tobacco Use   Smoking status: Never   Smokeless tobacco: Never  Vaping Use   Vaping Use: Never used  Substance and Sexual Activity  Alcohol use: Yes    Alcohol/week: 1.0 standard drink of alcohol    Types: 1 Glasses of wine per week    Comment: occ   Drug use: No   Sexual activity: Yes  Other Topics Concern   Not on file  Social History Narrative   Not on file   Social Determinants of Health   Financial Resource Strain: Not on file  Food Insecurity: Not on file  Transportation Needs: Not on file  Physical Activity: Not on file  Stress: Not on file  Social Connections: Not on file  Intimate Partner Violence: Not on file   Social History   Tobacco Use  Smoking Status Never  Smokeless Tobacco Never   Social History   Substance and Sexual Activity  Alcohol Use Yes   Alcohol/week: 1.0 standard drink of alcohol   Types: 1 Glasses of wine per week   Comment: occ    Family History:  Family History  Problem Relation Age of Onset   Diabetes Mother    Hypertension Mother    Arthritis Mother    Diabetes Father    Hypertension Father    Arthritis  Father    Diabetes Maternal Grandmother    Hypertension Maternal Grandmother    Hypertension Paternal Grandfather     Past medical history, surgical history, medications, allergies, family history and social history reviewed with patient today and changes made to appropriate areas of the chart.   Review of Systems  Constitutional: Negative.   HENT: Negative.    Eyes: Negative.   Respiratory: Negative.    Cardiovascular: Negative.   Gastrointestinal: Negative.   Genitourinary: Negative.   Musculoskeletal: Negative.   Skin: Negative.   Neurological: Negative.   Endo/Heme/Allergies: Negative.   Psychiatric/Behavioral: Negative.     All other ROS negative except what is listed above and in the HPI.      Objective:    BP 121/79   Pulse 67   Temp 97.9 F (36.6 C)   Ht 5' 10"  (1.778 m)   Wt 184 lb 3.2 oz (83.6 kg)   SpO2 97%   BMI 26.43 kg/m   Wt Readings from Last 3 Encounters:  05/30/22 184 lb 3.2 oz (83.6 kg)  05/16/21 184 lb 3.2 oz (83.6 kg)  05/15/20 180 lb 3.2 oz (81.7 kg)    Physical Exam Vitals and nursing note reviewed.  Constitutional:      General: He is not in acute distress.    Appearance: Normal appearance. He is obese. He is not ill-appearing, toxic-appearing or diaphoretic.  HENT:     Head: Normocephalic and atraumatic.     Right Ear: Tympanic membrane, ear canal and external ear normal. There is no impacted cerumen.     Left Ear: Tympanic membrane, ear canal and external ear normal. There is no impacted cerumen.     Nose: Nose normal. No congestion or rhinorrhea.     Mouth/Throat:     Mouth: Mucous membranes are moist.     Pharynx: Oropharynx is clear. No oropharyngeal exudate or posterior oropharyngeal erythema.  Eyes:     General: No scleral icterus.       Right eye: No discharge.        Left eye: No discharge.     Extraocular Movements: Extraocular movements intact.     Conjunctiva/sclera: Conjunctivae normal.     Pupils: Pupils are equal,  round, and reactive to light.  Neck:     Vascular: No carotid bruit.  Cardiovascular:  Rate and Rhythm: Normal rate and regular rhythm.     Pulses: Normal pulses.     Heart sounds: No murmur heard.    No friction rub. No gallop.  Pulmonary:     Effort: Pulmonary effort is normal. No respiratory distress.     Breath sounds: Normal breath sounds. No stridor. No wheezing, rhonchi or rales.  Chest:     Chest wall: No tenderness.  Abdominal:     General: Abdomen is flat. Bowel sounds are normal. There is no distension.     Palpations: Abdomen is soft. There is no mass.     Tenderness: There is no abdominal tenderness. There is no right CVA tenderness, left CVA tenderness, guarding or rebound.     Hernia: No hernia is present.  Genitourinary:    Comments: Genital exam deferred with shared decision making Musculoskeletal:        General: No swelling, tenderness, deformity or signs of injury.     Cervical back: Normal range of motion and neck supple. No rigidity. No muscular tenderness.     Right lower leg: No edema.     Left lower leg: No edema.  Lymphadenopathy:     Cervical: No cervical adenopathy.  Skin:    General: Skin is warm and dry.     Capillary Refill: Capillary refill takes less than 2 seconds.     Coloration: Skin is not jaundiced or pale.     Findings: No bruising, erythema, lesion or rash.  Neurological:     General: No focal deficit present.     Mental Status: He is alert and oriented to person, place, and time.     Cranial Nerves: No cranial nerve deficit.     Sensory: No sensory deficit.     Motor: No weakness.     Coordination: Coordination normal.     Gait: Gait normal.     Deep Tendon Reflexes: Reflexes normal.  Psychiatric:        Mood and Affect: Mood normal.        Behavior: Behavior normal.        Thought Content: Thought content normal.        Judgment: Judgment normal.     Results for orders placed or performed in visit on 05/16/21  Microscopic  Examination   Urine  Result Value Ref Range   WBC, UA None seen 0 - 5 /hpf   RBC 0-2 0 - 2 /hpf   Epithelial Cells (non renal) None seen 0 - 10 /hpf   Mucus, UA Present (A) Not Estab.   Bacteria, UA None seen None seen/Few  Comprehensive metabolic panel  Result Value Ref Range   Glucose 91 65 - 99 mg/dL   BUN 15 6 - 24 mg/dL   Creatinine, Ser 1.01 0.76 - 1.27 mg/dL   eGFR 89 >59 mL/min/1.73   BUN/Creatinine Ratio 15 9 - 20   Sodium 141 134 - 144 mmol/L   Potassium 4.5 3.5 - 5.2 mmol/L   Chloride 99 96 - 106 mmol/L   CO2 27 20 - 29 mmol/L   Calcium 9.8 8.7 - 10.2 mg/dL   Total Protein 8.0 6.0 - 8.5 g/dL   Albumin 5.0 (H) 3.8 - 4.9 g/dL   Globulin, Total 3.0 1.5 - 4.5 g/dL   Albumin/Globulin Ratio 1.7 1.2 - 2.2   Bilirubin Total 0.8 0.0 - 1.2 mg/dL   Alkaline Phosphatase 96 44 - 121 IU/L   AST 24 0 - 40 IU/L   ALT  18 0 - 44 IU/L  CBC with Differential/Platelet  Result Value Ref Range   WBC 5.2 3.4 - 10.8 x10E3/uL   RBC 5.23 4.14 - 5.80 x10E6/uL   Hemoglobin 16.3 13.0 - 17.7 g/dL   Hematocrit 47.6 37.5 - 51.0 %   MCV 91 79 - 97 fL   MCH 31.2 26.6 - 33.0 pg   MCHC 34.2 31.5 - 35.7 g/dL   RDW 12.9 11.6 - 15.4 %   Platelets 304 150 - 450 x10E3/uL   Neutrophils 40 Not Estab. %   Lymphs 36 Not Estab. %   Monocytes 7 Not Estab. %   Eos 15 Not Estab. %   Basos 2 Not Estab. %   Neutrophils Absolute 2.0 1.4 - 7.0 x10E3/uL   Lymphocytes Absolute 1.9 0.7 - 3.1 x10E3/uL   Monocytes Absolute 0.4 0.1 - 0.9 x10E3/uL   EOS (ABSOLUTE) 0.8 (H) 0.0 - 0.4 x10E3/uL   Basophils Absolute 0.1 0.0 - 0.2 x10E3/uL   Immature Granulocytes 0 Not Estab. %   Immature Grans (Abs) 0.0 0.0 - 0.1 x10E3/uL  Lipid Panel w/o Chol/HDL Ratio  Result Value Ref Range   Cholesterol, Total 193 100 - 199 mg/dL   Triglycerides 84 0 - 149 mg/dL   HDL 72 >39 mg/dL   VLDL Cholesterol Cal 15 5 - 40 mg/dL   LDL Chol Calc (NIH) 106 (H) 0 - 99 mg/dL  PSA  Result Value Ref Range   Prostate Specific Ag, Serum 0.4  0.0 - 4.0 ng/mL  TSH  Result Value Ref Range   TSH 1.930 0.450 - 4.500 uIU/mL  Urinalysis, Routine w reflex microscopic  Result Value Ref Range   Specific Gravity, UA >1.030 (H) 1.005 - 1.030   pH, UA 5.5 5.0 - 7.5   Color, UA Yellow Yellow   Appearance Ur Clear Clear   Leukocytes,UA Negative Negative   Protein,UA Negative Negative/Trace   Glucose, UA Negative Negative   Ketones, UA Negative Negative   RBC, UA 2+ (A) Negative   Bilirubin, UA Negative Negative   Urobilinogen, Ur 0.2 0.2 - 1.0 mg/dL   Nitrite, UA Negative Negative   Microscopic Examination See below:   Iron and TIBC  Result Value Ref Range   Total Iron Binding Capacity 402 250 - 450 ug/dL   UIBC 262 111 - 343 ug/dL   Iron 140 38 - 169 ug/dL   Iron Saturation 35 15 - 55 %  Ferritin  Result Value Ref Range   Ferritin 70 30 - 400 ng/mL  Hepatitis C Antibody  Result Value Ref Range   Hep C Virus Ab 0.1 0.0 - 0.9 s/co ratio      Assessment & Plan:   Problem List Items Addressed This Visit   None Visit Diagnoses     Routine general medical examination at a health care facility    -  Primary   Vaccines up to date. Screening labs checked today. Colonoscopy up to date. Continue diet and exercise. Call with any concerns.         LABORATORY TESTING:  Health maintenance labs ordered today as discussed above.   The natural history of prostate cancer and ongoing controversy regarding screening and potential treatment outcomes of prostate cancer has been discussed with the patient. The meaning of a false positive PSA and a false negative PSA has been discussed. He indicates understanding of the limitations of this screening test and wishes to proceed with screening PSA testing.   IMMUNIZATIONS:   -  Tdap: Tetanus vaccination status reviewed: last tetanus booster within 10 years. - Influenza: Postponed to flu season - Pneumovax: Not applicable - Prevnar: Not applicable - COVID: Up to date - HPV: Not  applicable - Shingrix vaccine:  due for 2nd shot in 1 month  SCREENING: - Colonoscopy: Up to date  Discussed with patient purpose of the colonoscopy is to detect colon cancer at curable precancerous or early stages   PATIENT COUNSELING:    Sexuality: Discussed sexually transmitted diseases, partner selection, use of condoms, avoidance of unintended pregnancy  and contraceptive alternatives.   Advised to avoid cigarette smoking.  I discussed with the patient that most people either abstain from alcohol or drink within safe limits (<=14/week and <=4 drinks/occasion for males, <=7/weeks and <= 3 drinks/occasion for females) and that the risk for alcohol disorders and other health effects rises proportionally with the number of drinks per week and how often a drinker exceeds daily limits.  Discussed cessation/primary prevention of drug use and availability of treatment for abuse.   Diet: Encouraged to adjust caloric intake to maintain  or achieve ideal body weight, to reduce intake of dietary saturated fat and total fat, to limit sodium intake by avoiding high sodium foods and not adding table salt, and to maintain adequate dietary potassium and calcium preferably from fresh fruits, vegetables, and low-fat dairy products.    stressed the importance of regular exercise  Injury prevention: Discussed safety belts, safety helmets, smoke detector, smoking near bedding or upholstery.   Dental health: Discussed importance of regular tooth brushing, flossing, and dental visits.   Follow up plan: NEXT PREVENTATIVE PHYSICAL DUE IN 1 YEAR. Return in about 1 year (around 05/31/2023) for physical.

## 2022-05-31 LAB — CBC WITH DIFFERENTIAL/PLATELET
Basophils Absolute: 0.1 10*3/uL (ref 0.0–0.2)
Basos: 2 %
EOS (ABSOLUTE): 0.5 10*3/uL — ABNORMAL HIGH (ref 0.0–0.4)
Eos: 11 %
Hematocrit: 45.7 % (ref 37.5–51.0)
Hemoglobin: 15.9 g/dL (ref 13.0–17.7)
Immature Grans (Abs): 0 10*3/uL (ref 0.0–0.1)
Immature Granulocytes: 0 %
Lymphocytes Absolute: 1.9 10*3/uL (ref 0.7–3.1)
Lymphs: 40 %
MCH: 32 pg (ref 26.6–33.0)
MCHC: 34.8 g/dL (ref 31.5–35.7)
MCV: 92 fL (ref 79–97)
Monocytes Absolute: 0.3 10*3/uL (ref 0.1–0.9)
Monocytes: 7 %
Neutrophils Absolute: 1.9 10*3/uL (ref 1.4–7.0)
Neutrophils: 40 %
Platelets: 288 10*3/uL (ref 150–450)
RBC: 4.97 x10E6/uL (ref 4.14–5.80)
RDW: 13.1 % (ref 11.6–15.4)
WBC: 4.7 10*3/uL (ref 3.4–10.8)

## 2022-05-31 LAB — COMPREHENSIVE METABOLIC PANEL
ALT: 27 IU/L (ref 0–44)
AST: 26 IU/L (ref 0–40)
Albumin/Globulin Ratio: 1.7 (ref 1.2–2.2)
Albumin: 5 g/dL — ABNORMAL HIGH (ref 3.8–4.9)
Alkaline Phosphatase: 94 IU/L (ref 44–121)
BUN/Creatinine Ratio: 16 (ref 9–20)
BUN: 15 mg/dL (ref 6–24)
Bilirubin Total: 0.5 mg/dL (ref 0.0–1.2)
CO2: 25 mmol/L (ref 20–29)
Calcium: 9.6 mg/dL (ref 8.7–10.2)
Chloride: 101 mmol/L (ref 96–106)
Creatinine, Ser: 0.96 mg/dL (ref 0.76–1.27)
Globulin, Total: 2.9 g/dL (ref 1.5–4.5)
Glucose: 96 mg/dL (ref 70–99)
Potassium: 4.2 mmol/L (ref 3.5–5.2)
Sodium: 141 mmol/L (ref 134–144)
Total Protein: 7.9 g/dL (ref 6.0–8.5)
eGFR: 94 mL/min/{1.73_m2} (ref >59)

## 2022-05-31 LAB — LIPID PANEL W/O CHOL/HDL RATIO
Cholesterol, Total: 178 mg/dL (ref 100–199)
HDL: 63 mg/dL (ref >39)
LDL Chol Calc (NIH): 105 mg/dL — ABNORMAL HIGH (ref 0–99)
Triglycerides: 53 mg/dL (ref 0–149)
VLDL Cholesterol Cal: 10 mg/dL (ref 5–40)

## 2022-05-31 LAB — TSH: TSH: 1.74 u[IU]/mL (ref 0.450–4.500)

## 2022-05-31 LAB — PSA: Prostate Specific Ag, Serum: 0.6 ng/mL (ref 0.0–4.0)

## 2022-06-02 ENCOUNTER — Encounter: Payer: Self-pay | Admitting: Family Medicine

## 2022-10-13 ENCOUNTER — Encounter: Payer: Self-pay | Admitting: Oncology

## 2022-10-14 ENCOUNTER — Encounter: Payer: Self-pay | Admitting: Oncology

## 2023-05-25 ENCOUNTER — Encounter: Payer: Self-pay | Admitting: Oncology

## 2023-06-04 ENCOUNTER — Encounter: Payer: 59 | Admitting: Family Medicine

## 2023-10-29 ENCOUNTER — Ambulatory Visit (INDEPENDENT_AMBULATORY_CARE_PROVIDER_SITE_OTHER): Payer: 59 | Admitting: Family Medicine

## 2023-10-29 ENCOUNTER — Encounter: Payer: Self-pay | Admitting: Family Medicine

## 2023-10-29 VITALS — BP 131/81 | HR 65 | Ht 69.0 in | Wt 192.4 lb

## 2023-10-29 DIAGNOSIS — Z0000 Encounter for general adult medical examination without abnormal findings: Secondary | ICD-10-CM

## 2023-10-29 DIAGNOSIS — D509 Iron deficiency anemia, unspecified: Secondary | ICD-10-CM

## 2023-10-29 DIAGNOSIS — Z23 Encounter for immunization: Secondary | ICD-10-CM

## 2023-10-29 NOTE — Progress Notes (Signed)
BP 131/81   Pulse 65   Ht 5\' 9"  (1.753 m)   Wt 192 lb 6.4 oz (87.3 kg)   SpO2 100%   BMI 28.41 kg/m    Subjective:    Patient ID: Garrett Barajas, male    DOB: 02-11-1966, 57 y.o.   MRN: [id number]  HPI: Artin Bouch is a 57 y.o. male presenting on 10/29/2023 for comprehensive medical examination. Current medical complaints include:  ANEMIA Anemia status: controlled Etiology of anemia: iron deficiency Duration of anemia treatment: chronic Compliance with treatment: excellent compliance Iron supplementation side effects: no Severity of anemia: mild Fatigue: no Decreased exercise tolerance: no  Dyspnea on exertion: no Palpitations: no Bleeding: no Pica: no   He currently lives with: wife Interim Problems from his last visit: no  Depression Screen done today and results listed below:     10/29/2023    2:15 PM 05/30/2022    1:17 PM 05/16/2021    8:08 AM 05/15/2020    1:17 PM 09/16/2018    9:19 AM  Depression screen PHQ 2/9  Decreased Interest 0 2 0 0 0  Down, Depressed, Hopeless 0 0 0 1 0  PHQ - 2 Score 0 2 0 1 0  Altered sleeping  2  0 0  Tired, decreased energy  0  0 0  Change in appetite  0  0 0  Feeling bad or failure about yourself   0  0 0  Trouble concentrating  0  1 0  Moving slowly or fidgety/restless  0  0 0  Suicidal thoughts  0  0 0  PHQ-9 Score  4  2 0  Difficult doing work/chores  Not difficult at all  Not difficult at all     Past Medical History:  Past Medical History:  Diagnosis Date   Dizziness    External hemorrhoids without mention of complication    Iron deficiency anemia    a. 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    Third degree hemorrhoids    Undiagnosed cardiac murmurs    HAD W/U DONE (EKG, ECHO) ALL NORMAL     Surgical History:  Past Surgical History:  Procedure Laterality Date   ADENOIDECTOMY     COLONOSCOPY      COLONOSCOPY WITH PROPOFOL N/A 04/10/2016   Procedure: COLONOSCOPY WITH PROPOFOL;  Surgeon: Midge Minium, MD;  Location: [facility];  Service: Endoscopy;  Laterality: N/A;   eye growth removal     HEMORRHOID SURGERY N/A 06/09/2016   Procedure: HEMORRHOIDECTOMY;  Surgeon: Ricarda Frame, MD;  Location: [facility];  Service: General;  Laterality: N/A;   HEMORRHOID SURGERY N/A 07/15/2016   Procedure: HEMORRHOIDECTOMY;  Surgeon: Ricarda Frame, MD;  Location: [facility];  Service: General;  Laterality: N/A;   RECTAL EXAM UNDER ANESTHESIA N/A 07/15/2016   Procedure: RECTAL EXAM UNDER ANESTHESIA;  Surgeon: Ricarda Frame, MD;  Location: [facility];  Service: General;  Laterality: N/A;   TONSILLECTOMY      Medications:  No current outpatient medications on file prior to visit.   No current facility-administered medications on file prior to visit.    Allergies:  No Known Allergies  Social History:  Social History   Socioeconomic History   Marital status: Married    Spouse name: Not on file   Number of children: Not on file   Years of education: Not on file   Highest  education level: Not on file  Occupational History   Not on file  Tobacco Use   Smoking status: Never   Smokeless tobacco: Never  Vaping Use   Vaping status: Never Used  Substance and Sexual Activity   Alcohol use: Yes    Alcohol/week: 1.0 standard drink of alcohol    Types: 1 Glasses of wine per week    Comment: occ   Drug use: No   Sexual activity: Yes  Other Topics Concern   Not on file  Social History Narrative   Not on file   Social Determinants of Health   Financial Resource Strain: Not on file  Food Insecurity: Not on file  Transportation Needs: Not on file  Physical Activity: Not on file  Stress: Not on file  Social Connections: Not on file  Intimate Partner Violence: Not on file   Social History   Tobacco Use  Smoking Status Never  Smokeless Tobacco Never   Social History   Substance and  Sexual Activity  Alcohol Use Yes   Alcohol/week: 1.0 standard drink of alcohol   Types: 1 Glasses of wine per week   Comment: occ    Family History:  Family History  Problem Relation Age of Onset   Diabetes Mother    Hypertension Mother    Arthritis Mother    Diabetes Father    Hypertension Father    Arthritis Father    Diabetes Maternal Grandmother    Hypertension Maternal Grandmother    Hypertension Paternal Grandfather     Past medical history, surgical history, medications, allergies, family history and social history reviewed with patient today and changes made to appropriate areas of the chart.   Review of Systems  Constitutional: Negative.   HENT: Negative.    Eyes:  Positive for blurred vision. Negative for double vision, photophobia, pain, discharge and redness.  Respiratory: Negative.    Cardiovascular: Negative.   Gastrointestinal: Negative.   Genitourinary: Negative.   Musculoskeletal: Negative.   Skin: Negative.   Neurological: Negative.   Endo/Heme/Allergies:  Positive for environmental allergies. Negative for polydipsia. Does not bruise/bleed easily.  Psychiatric/Behavioral: Negative.     All other ROS negative except what is listed above and in the HPI.      Objective:    BP 131/81   Pulse 65   Ht 5\' 9"  (1.753 m)   Wt 192 lb 6.4 oz (87.3 kg)   SpO2 100%   BMI 28.41 kg/m   Wt Readings from Last 3 Encounters:  10/29/23 192 lb 6.4 oz (87.3 kg)  05/30/22 184 lb 3.2 oz (83.6 kg)  05/16/21 184 lb 3.2 oz (83.6 kg)    Physical Exam Vitals and nursing note reviewed.  Constitutional:      General: He is not in acute distress.    Appearance: Normal appearance. He is not ill-appearing, toxic-appearing or diaphoretic.  HENT:     Head: Normocephalic and atraumatic.     Right Ear: Tympanic membrane, ear canal and external ear normal. There is no impacted cerumen.     Left Ear: Tympanic membrane, ear canal and external ear normal. There is no impacted  cerumen.     Nose: Nose normal. No congestion or rhinorrhea.     Mouth/Throat:     Mouth: Mucous membranes are moist.     Pharynx: Oropharynx is clear. No oropharyngeal exudate or posterior oropharyngeal erythema.  Eyes:     General: No scleral icterus.       Right eye: No discharge.  Left eye: No discharge.     Extraocular Movements: Extraocular movements intact.     Conjunctiva/sclera: Conjunctivae normal.     Pupils: Pupils are equal, round, and reactive to light.  Neck:     Vascular: No carotid bruit.  Cardiovascular:     Rate and Rhythm: Normal rate and regular rhythm.     Pulses: Normal pulses.     Heart sounds: No murmur heard.    No friction rub. No gallop.  Pulmonary:     Effort: Pulmonary effort is normal. No respiratory distress.     Breath sounds: Normal breath sounds. No stridor. No wheezing, rhonchi or rales.  Chest:     Chest wall: No tenderness.  Abdominal:     General: Abdomen is flat. Bowel sounds are normal. There is no distension.     Palpations: Abdomen is soft. There is no mass.     Tenderness: There is no abdominal tenderness. There is no right CVA tenderness, left CVA tenderness, guarding or rebound.     Hernia: No hernia is present.  Genitourinary:    Comments: Genital exam deferred with shared decision making Musculoskeletal:        General: No swelling, tenderness, deformity or signs of injury.     Cervical back: Normal range of motion and neck supple. No rigidity. No muscular tenderness.     Right lower leg: No edema.     Left lower leg: No edema.  Lymphadenopathy:     Cervical: No cervical adenopathy.  Skin:    General: Skin is warm and dry.     Capillary Refill: Capillary refill takes less than 2 seconds.     Coloration: Skin is not jaundiced or pale.     Findings: No bruising, erythema, lesion or rash.  Neurological:     General: No focal deficit present.     Mental Status: He is alert and oriented to person, place, and time.      Cranial Nerves: No cranial nerve deficit.     Sensory: No sensory deficit.     Motor: No weakness.     Coordination: Coordination normal.     Gait: Gait normal.     Deep Tendon Reflexes: Reflexes normal.  Psychiatric:        Mood and Affect: Mood normal.        Behavior: Behavior normal.        Thought Content: Thought content normal.        Judgment: Judgment normal.      Results for orders placed or performed in visit on 05/30/22  Microscopic Examination   BLD  Result Value Ref Range   WBC, UA None seen 0 - 5 /hpf   RBC, Urine 0-2 0 - 2 /hpf   Epithelial Cells (non renal) None seen 0 - 10 /hpf   Bacteria, UA None seen None seen/Few  CBC with Differential/Platelet  Result Value Ref Range   WBC 4.7 3.4 - 10.8 x10E3/uL   RBC 4.97 4.14 - 5.80 x10E6/uL   Hemoglobin 15.9 13.0 - 17.7 g/dL   Hematocrit 54.0 98.1 - 51.0 %   MCV 92 79 - 97 fL   MCH 32.0 26.6 - 33.0 pg   MCHC 34.8 31.5 - 35.7 g/dL   RDW 19.1 47.8 - 29.5 %   Platelets 288 150 - 450 x10E3/uL   Neutrophils 40 Not Estab. %   Lymphs 40 Not Estab. %   Monocytes 7 Not Estab. %   Eos 11 Not Estab. %  Basos 2 Not Estab. %   Neutrophils Absolute 1.9 1.4 - 7.0 x10E3/uL   Lymphocytes Absolute 1.9 0.7 - 3.1 x10E3/uL   Monocytes Absolute 0.3 0.1 - 0.9 x10E3/uL   EOS (ABSOLUTE) 0.5 (H) 0.0 - 0.4 x10E3/uL   Basophils Absolute 0.1 0.0 - 0.2 x10E3/uL   Immature Granulocytes 0 Not Estab. %   Immature Grans (Abs) 0.0 0.0 - 0.1 x10E3/uL  Comprehensive metabolic panel  Result Value Ref Range   Glucose 96 70 - 99 mg/dL   BUN 15 6 - 24 mg/dL   Creatinine, Ser 4.09 0.76 - 1.27 mg/dL   eGFR 94 >81 XB/JYN/8.29   BUN/Creatinine Ratio 16 9 - 20   Sodium 141 134 - 144 mmol/L   Potassium 4.2 3.5 - 5.2 mmol/L   Chloride 101 96 - 106 mmol/L   CO2 25 20 - 29 mmol/L   Calcium 9.6 8.7 - 10.2 mg/dL   Total Protein 7.9 6.0 - 8.5 g/dL   Albumin 5.0 (H) 3.8 - 4.9 g/dL   Globulin, Total 2.9 1.5 - 4.5 g/dL   Albumin/Globulin Ratio 1.7 1.2  - 2.2   Bilirubin Total 0.5 0.0 - 1.2 mg/dL   Alkaline Phosphatase 94 44 - 121 IU/L   AST 26 0 - 40 IU/L   ALT 27 0 - 44 IU/L  Lipid Panel w/o Chol/HDL Ratio  Result Value Ref Range   Cholesterol, Total 178 100 - 199 mg/dL   Triglycerides 53 0 - 149 mg/dL   HDL 63 >56 mg/dL   VLDL Cholesterol Cal 10 5 - 40 mg/dL   LDL Chol Calc (NIH) 213 (H) 0 - 99 mg/dL  PSA  Result Value Ref Range   Prostate Specific Ag, Serum 0.6 0.0 - 4.0 ng/mL  TSH  Result Value Ref Range   TSH 1.740 0.450 - 4.500 uIU/mL  Urinalysis, Routine w reflex microscopic  Result Value Ref Range   Specific Gravity, UA 1.010 1.005 - 1.030   pH, UA 6.5 5.0 - 7.5   Color, UA Yellow Yellow   Appearance Ur Clear Clear   Leukocytes,UA Negative Negative   Protein,UA Negative Negative/Trace   Glucose, UA Negative Negative   Ketones, UA Negative Negative   RBC, UA Trace (A) Negative   Bilirubin, UA Negative Negative   Urobilinogen, Ur 0.2 0.2 - 1.0 mg/dL   Nitrite, UA Negative Negative   Microscopic Examination See below:       Assessment & Plan:   Problem List Items Addressed This Visit       Other   Iron deficiency anemia    Rechecking labs today. Await results. Treat as needed.       Relevant Orders   CBC with Differential/Platelet   Iron Binding Cap (TIBC)([facility])   Ferritin   Other Visit Diagnoses     Routine general medical examination at a health care facility    -  Primary   Vaccines up to date. Screening labs checked today. Colonoscopy up to date. Continue diet and exercise. Call with any concerns.   Relevant Orders   Comprehensive metabolic panel   Lipid Panel w/o Chol/HDL Ratio   PSA   TSH   Need for diphtheria-tetanus-pertussis (Tdap) vaccine       tdap given today   Relevant Orders   Tdap vaccine greater than or equal to 7yo IM (Completed)        LABORATORY TESTING:  Health maintenance labs ordered today as discussed above.   The natural history of prostate cancer  and  ongoing controversy regarding screening and potential treatment outcomes of prostate cancer has been discussed with the patient. The meaning of a false positive PSA and a false negative PSA has been discussed. He indicates understanding of the limitations of this screening test and wishes to proceed with screening PSA testing.   IMMUNIZATIONS:   - Tdap: Tetanus vaccination status reviewed: last tetanus booster within 10 years. - Influenza: Up to date - Pneumovax: Not applicable - Prevnar: Not applicable - COVID: Up to date - HPV: Not applicable - Shingrix vaccine: Up to date  SCREENING: - Colonoscopy: Up to date  Discussed with patient purpose of the colonoscopy is to detect colon cancer at curable precancerous or early stages   PATIENT COUNSELING:    Sexuality: Discussed sexually transmitted diseases, partner selection, use of condoms, avoidance of unintended pregnancy  and contraceptive alternatives.   Advised to avoid cigarette smoking.  I discussed with the patient that most people either abstain from alcohol or drink within safe limits (<=14/week and <=4 drinks/occasion for males, <=7/weeks and <= 3 drinks/occasion for females) and that the risk for alcohol disorders and other health effects rises proportionally with the number of drinks per week and how often a drinker exceeds daily limits.  Discussed cessation/primary prevention of drug use and availability of treatment for abuse.   Diet: Encouraged to adjust caloric intake to maintain  or achieve ideal body weight, to reduce intake of dietary saturated fat and total fat, to limit sodium intake by avoiding high sodium foods and not adding table salt, and to maintain adequate dietary potassium and calcium preferably from fresh fruits, vegetables, and low-fat dairy products.    stressed the importance of regular exercise  Injury prevention: Discussed safety belts, safety helmets, smoke detector, smoking near bedding or upholstery.    Dental health: Discussed importance of regular tooth brushing, flossing, and dental visits.   Follow up plan: NEXT PREVENTATIVE PHYSICAL DUE IN 1 YEAR. No follow-ups on file.

## 2023-10-29 NOTE — Assessment & Plan Note (Signed)
Rechecking labs today. Await results. Treat as needed.  °

## 2023-10-30 ENCOUNTER — Encounter: Payer: Self-pay | Admitting: Family Medicine

## 2023-10-30 ENCOUNTER — Telehealth: Payer: Self-pay

## 2023-10-30 NOTE — Telephone Encounter (Signed)
Fax request sent to [facility] to send our office the recent vaccine information.

## 2023-10-30 NOTE — Telephone Encounter (Signed)
-----   Message from Olevia Perches sent at 10/29/2023  2:30 PM EST ----- Shingles shot at [facility] in [place] can we get records whenever anyone has time

## 2023-11-02 ENCOUNTER — Encounter: Payer: Self-pay | Admitting: Family Medicine

## 2023-11-02 LAB — CBC WITH DIFFERENTIAL/PLATELET
Basophils Absolute: 0.1 10*3/uL (ref 0.0–0.2)
Basos: 1 %
EOS (ABSOLUTE): 0.7 10*3/uL — ABNORMAL HIGH (ref 0.0–0.4)
Eos: 11 %
Hematocrit: 47 % (ref 37.5–51.0)
Hemoglobin: 16.2 g/dL (ref 13.0–17.7)
Immature Grans (Abs): 0 10*3/uL (ref 0.0–0.1)
Immature Granulocytes: 0 %
Lymphocytes Absolute: 2.3 10*3/uL (ref 0.7–3.1)
Lymphs: 36 %
MCH: 31.4 pg (ref 26.6–33.0)
MCHC: 34.5 g/dL (ref 31.5–35.7)
MCV: 91 fL (ref 79–97)
Monocytes Absolute: 0.4 10*3/uL (ref 0.1–0.9)
Monocytes: 6 %
Neutrophils Absolute: 3 10*3/uL (ref 1.4–7.0)
Neutrophils: 46 %
Platelets: 271 10*3/uL (ref 150–450)
RBC: 5.16 x10E6/uL (ref 4.14–5.80)
RDW: 12.7 % (ref 11.6–15.4)
WBC: 6.6 10*3/uL (ref 3.4–10.8)

## 2023-11-02 LAB — COMPREHENSIVE METABOLIC PANEL
ALT: 23 [IU]/L (ref 0–44)
AST: 25 IU/L (ref 0–40)
Albumin: 4.7 g/dL (ref 3.8–4.9)
Alkaline Phosphatase: 104 IU/L (ref 44–121)
BUN/Creatinine Ratio: 16 (ref 9–20)
BUN: 16 mg/dL (ref 6–24)
Bilirubin Total: 0.4 mg/dL (ref 0.0–1.2)
CO2: 26 mmol/L (ref 20–29)
Calcium: 9.5 mg/dL (ref 8.7–10.2)
Chloride: 101 mmol/L (ref 96–106)
Creatinine, Ser: 0.98 mg/dL (ref 0.76–1.27)
Globulin, Total: 3 g/dL (ref 1.5–4.5)
Glucose: 95 mg/dL (ref 70–99)
Potassium: 4.5 mmol/L (ref 3.5–5.2)
Sodium: 142 mmol/L (ref 134–144)
Total Protein: 7.7 g/dL (ref 6.0–8.5)
eGFR: 91 mL/min/{1.73_m2} (ref >59)

## 2023-11-02 LAB — LIPID PANEL W/O CHOL/HDL RATIO
Cholesterol, Total: 169 mg/dL (ref 100–199)
HDL: 64 mg/dL (ref >39)
LDL Chol Calc (NIH): 85 mg/dL (ref 0–99)
Triglycerides: 113 mg/dL (ref 0–149)
VLDL Cholesterol Cal: 20 mg/dL (ref 5–40)

## 2023-11-02 LAB — IRON AND TIBC
Iron Saturation: 25 % (ref 15–55)
Iron: 88 ug/dL (ref 38–169)
Total Iron Binding Capacity: 353 ug/dL (ref 250–450)
UIBC: 265 ug/dL (ref 111–343)

## 2023-11-02 LAB — FERRITIN: Ferritin: 74 ng/mL (ref 30–400)

## 2023-11-02 LAB — TSH: TSH: 1.28 u[IU]/mL (ref 0.450–4.500)

## 2023-11-02 LAB — PSA: Prostate Specific Ag, Serum: 0.4 ng/mL (ref 0.0–4.0)

## 2023-11-02 NOTE — Progress Notes (Signed)
Printed and mailed on 11/02/2023.

## 2024-10-27 ENCOUNTER — Encounter: Payer: Self-pay | Admitting: Oncology

## 2024-11-03 ENCOUNTER — Encounter: Payer: Self-pay | Admitting: Family Medicine

## 2024-11-03 ENCOUNTER — Ambulatory Visit (INDEPENDENT_AMBULATORY_CARE_PROVIDER_SITE_OTHER): Payer: Self-pay | Admitting: Family Medicine

## 2024-11-03 VITALS — BP 142/80 | HR 59 | Ht 69.0 in | Wt 187.6 lb

## 2024-11-03 DIAGNOSIS — R030 Elevated blood-pressure reading, without diagnosis of hypertension: Secondary | ICD-10-CM | POA: Diagnosis not present

## 2024-11-03 DIAGNOSIS — Z0000 Encounter for general adult medical examination without abnormal findings: Secondary | ICD-10-CM | POA: Diagnosis not present

## 2024-11-03 NOTE — Progress Notes (Signed)
 BP (!) 142/80   Pulse (!) 59   Ht 5' 9 (1.753 m)   Wt 187 lb 9.6 oz (85.1 kg)   SpO2 100%   BMI 27.70 kg/m    Subjective:    Patient ID: Garrett Barajas, male    DOB: 17-May-1966, 58 y.o.   MRN: [id number]  HPI: Garrett Barajas is a 58 y.o. male presenting on 11/03/2024 for comprehensive medical examination. Current medical complaints include: none  He currently lives with: wife Interim Problems from his last visit: no  Depression Screen done today and results listed below:     11/03/2024    8:20 AM 11/03/2024    8:17 AM 10/29/2023    2:15 PM 05/30/2022    1:17 PM 05/16/2021    8:08 AM  Depression screen PHQ 2/9  Decreased Interest 0 0 0 2 0  Down, Depressed, Hopeless 0 0 0 0 0  PHQ - 2 Score 0 0 0 2 0  Altered sleeping 0   2   Tired, decreased energy 0   0   Change in appetite 0   0   Feeling bad or failure about yourself  0   0   Trouble concentrating 1   0   Moving slowly or fidgety/restless 0   0   Suicidal thoughts 0   0   PHQ-9 Score 1   4    Difficult doing work/chores Not difficult at all   Not difficult at all      Data saved with a previous flowsheet row definition     Past Medical History:  Past Medical History:  Diagnosis Date   Dizziness    External hemorrhoids without mention of complication    Iron deficiency anemia    a. Hgb 6.0 on 05/18/14- started on ferrous fumarate , patient is a [employer]'s Witness   Refusal of blood transfusions as patient is [employer] Witness    Third degree hemorrhoids    Undiagnosed cardiac murmurs    HAD W/U DONE (EKG, ECHO) ALL NORMAL     Surgical History:  Past Surgical History:  Procedure Laterality Date   ADENOIDECTOMY     COLONOSCOPY     COLONOSCOPY WITH PROPOFOL  N/A 04/10/2016   Procedure: COLONOSCOPY WITH PROPOFOL ;  Surgeon: Rogelia Copping, MD;  Location: [facility];  Service: Endoscopy;  Laterality: N/A;   eye growth removal     HEMORRHOID SURGERY N/A 06/09/2016   Procedure: HEMORRHOIDECTOMY;  Surgeon:  Carlin Pastel, MD;  Location: [facility];  Service: General;  Laterality: N/A;   HEMORRHOID SURGERY N/A 07/15/2016   Procedure: HEMORRHOIDECTOMY;  Surgeon: Carlin Pastel, MD;  Location: [facility];  Service: General;  Laterality: N/A;   RECTAL EXAM UNDER ANESTHESIA N/A 07/15/2016   Procedure: RECTAL EXAM UNDER ANESTHESIA;  Surgeon: Carlin Pastel, MD;  Location: [facility];  Service: General;  Laterality: N/A;   TONSILLECTOMY      Medications:  No current outpatient medications on file prior to visit.   No current facility-administered medications on file prior to visit.    Allergies:  No Known Allergies  Social History:  Social History   Socioeconomic History   Marital status: Married    Spouse name: Not on file   Number of children: Not on file   Years of education: Not on file   Highest education level: Not on file  Occupational History   Not on file  Tobacco Use   Smoking status: Never   Smokeless tobacco: Never  Vaping Use  Vaping status: Never Used  Substance and Sexual Activity   Alcohol use: Yes    Alcohol/week: 1.0 standard drink of alcohol    Types: 1 Glasses of wine per week    Comment: occ   Drug use: No   Sexual activity: Yes  Other Topics Concern   Not on file  Social History Narrative   Not on file   Social Drivers of Health   Financial Resource Strain: Low Risk  (11/03/2024)   Overall Financial Resource Strain (CARDIA)    Difficulty of Paying Living Expenses: Not hard at all  Food Insecurity: No Food Insecurity (11/03/2024)   Hunger Vital Sign    Worried About Running Out of Food in the Last Year: Never true    Ran Out of Food in the Last Year: Never true  Transportation Needs: No Transportation Needs (11/03/2024)   PRAPARE - Administrator, Civil Service (Medical): No    Lack of Transportation (Non-Medical): No  Physical Activity: Sufficiently Active (11/03/2024)   Exercise Vital Sign    Days of Exercise per Week: 2 days    Minutes  of Exercise per Session: 90 min  Stress: No Stress Concern Present (11/03/2024)   [employer] of Occupational Health - Occupational Stress Questionnaire    Feeling of Stress: Only a little  Social Connections: Moderately Integrated (11/03/2024)   Social Connection and Isolation Panel    Frequency of Communication with Friends and Family: Three times a week    Frequency of Social Gatherings with Friends and Family: Three times a week    Attends Religious Services: More than 4 times per year    Active Member of Clubs or Organizations: No    Attends Banker Meetings: Never    Marital Status: Married  Catering Manager Violence: Not At Risk (11/03/2024)   Humiliation, Afraid, Rape, and Kick questionnaire    Fear of Current or Ex-Partner: No    Emotionally Abused: No    Physically Abused: No    Sexually Abused: No   Social History   Tobacco Use  Smoking Status Never  Smokeless Tobacco Never   Social History   Substance and Sexual Activity  Alcohol Use Yes   Alcohol/week: 1.0 standard drink of alcohol   Types: 1 Glasses of wine per week   Comment: occ    Family History:  Family History  Problem Relation Age of Onset   Diabetes Mother    Hypertension Mother    Arthritis Mother    Cancer Father        skin cancer   Diabetes Father    Hypertension Father    Arthritis Father    Diabetes Maternal Grandmother    Hypertension Maternal Grandmother    Hypertension Paternal Grandfather     Past medical history, surgical history, medications, allergies, family history and social history reviewed with patient today and changes made to appropriate areas of the chart.   Review of Systems  Constitutional: Negative.   HENT: Negative.    Eyes: Negative.   Respiratory: Negative.    Cardiovascular: Negative.   Gastrointestinal: Negative.   Genitourinary: Negative.   Musculoskeletal: Negative.   Skin: Negative.   Neurological: Negative.   Endo/Heme/Allergies:  Negative.    All other ROS negative except what is listed above and in the HPI.      Objective:    BP (!) 142/80   Pulse (!) 59   Ht 5' 9 (1.753 m)   Wt 187 lb 9.6  oz (85.1 kg)   SpO2 100%   BMI 27.70 kg/m   Wt Readings from Last 3 Encounters:  11/03/24 187 lb 9.6 oz (85.1 kg)  10/29/23 192 lb 6.4 oz (87.3 kg)  05/30/22 184 lb 3.2 oz (83.6 kg)    Physical Exam Vitals and nursing note reviewed.  Constitutional:      General: He is not in acute distress.    Appearance: Normal appearance. He is not ill-appearing, toxic-appearing or diaphoretic.  HENT:     Head: Normocephalic and atraumatic.     Right Ear: Tympanic membrane, ear canal and external ear normal. There is no impacted cerumen.     Left Ear: Tympanic membrane, ear canal and external ear normal. There is no impacted cerumen.     Nose: Nose normal. No congestion or rhinorrhea.     Mouth/Throat:     Mouth: Mucous membranes are moist.     Pharynx: Oropharynx is clear. No oropharyngeal exudate or posterior oropharyngeal erythema.  Eyes:     General: No scleral icterus.       Right eye: No discharge.        Left eye: No discharge.     Extraocular Movements: Extraocular movements intact.     Conjunctiva/sclera: Conjunctivae normal.     Pupils: Pupils are equal, round, and reactive to light.  Neck:     Vascular: No carotid bruit.  Cardiovascular:     Rate and Rhythm: Normal rate and regular rhythm.     Pulses: Normal pulses.     Heart sounds: No murmur heard.    No friction rub. No gallop.  Pulmonary:     Effort: Pulmonary effort is normal. No respiratory distress.     Breath sounds: Normal breath sounds. No stridor. No wheezing, rhonchi or rales.  Chest:     Chest wall: No tenderness.  Abdominal:     General: Abdomen is flat. Bowel sounds are normal. There is no distension.     Palpations: Abdomen is soft. There is no mass.     Tenderness: There is no abdominal tenderness. There is no right CVA tenderness, left  CVA tenderness, guarding or rebound.     Hernia: No hernia is present.  Genitourinary:    Comments: Genital exam deferred with shared decision making Musculoskeletal:        General: No swelling, tenderness, deformity or signs of injury.     Cervical back: Normal range of motion and neck supple. No rigidity. No muscular tenderness.     Right lower leg: No edema.     Left lower leg: No edema.  Lymphadenopathy:     Cervical: No cervical adenopathy.  Skin:    General: Skin is warm and dry.     Capillary Refill: Capillary refill takes less than 2 seconds.     Coloration: Skin is not jaundiced or pale.     Findings: No bruising, erythema, lesion or rash.  Neurological:     General: No focal deficit present.     Mental Status: He is alert and oriented to person, place, and time.     Cranial Nerves: No cranial nerve deficit.     Sensory: No sensory deficit.     Motor: No weakness.     Coordination: Coordination normal.     Gait: Gait normal.     Deep Tendon Reflexes: Reflexes normal.  Psychiatric:        Mood and Affect: Mood normal.        Behavior: Behavior normal.  Thought Content: Thought content normal.        Judgment: Judgment normal.     Results for orders placed or performed in visit on 10/29/23  Comprehensive metabolic panel   Collection Time: 10/29/23  2:35 PM  Result Value Ref Range   Glucose 95 70 - 99 mg/dL   BUN 16 6 - 24 mg/dL   Creatinine, Ser 9.01 0.76 - 1.27 mg/dL   eGFR 91 >40 fO/fpw/8.26   BUN/Creatinine Ratio 16 9 - 20   Sodium 142 134 - 144 mmol/L   Potassium 4.5 3.5 - 5.2 mmol/L   Chloride 101 96 - 106 mmol/L   CO2 26 20 - 29 mmol/L   Calcium 9.5 8.7 - 10.2 mg/dL   Total Protein 7.7 6.0 - 8.5 g/dL   Albumin 4.7 3.8 - 4.9 g/dL   Globulin, Total 3.0 1.5 - 4.5 g/dL   Bilirubin Total 0.4 0.0 - 1.2 mg/dL   Alkaline Phosphatase 104 44 - 121 IU/L   AST 25 0 - 40 IU/L   ALT 23 0 - 44 IU/L  CBC with Differential/Platelet   Collection Time: 10/29/23   2:35 PM  Result Value Ref Range   WBC 6.6 3.4 - 10.8 x10E3/uL   RBC 5.16 4.14 - 5.80 x10E6/uL   Hemoglobin 16.2 13.0 - 17.7 g/dL   Hematocrit 52.9 62.4 - 51.0 %   MCV 91 79 - 97 fL   MCH 31.4 26.6 - 33.0 pg   MCHC 34.5 31.5 - 35.7 g/dL   RDW 87.2 88.3 - 84.5 %   Platelets 271 150 - 450 x10E3/uL   Neutrophils 46 Not Estab. %   Lymphs 36 Not Estab. %   Monocytes 6 Not Estab. %   Eos 11 Not Estab. %   Basos 1 Not Estab. %   Neutrophils Absolute 3.0 1.4 - 7.0 x10E3/uL   Lymphocytes Absolute 2.3 0.7 - 3.1 x10E3/uL   Monocytes Absolute 0.4 0.1 - 0.9 x10E3/uL   EOS (ABSOLUTE) 0.7 (H) 0.0 - 0.4 x10E3/uL   Basophils Absolute 0.1 0.0 - 0.2 x10E3/uL   Immature Granulocytes 0 Not Estab. %   Immature Grans (Abs) 0.0 0.0 - 0.1 x10E3/uL  Lipid Panel w/o Chol/HDL Ratio   Collection Time: 10/29/23  2:35 PM  Result Value Ref Range   Cholesterol, Total 169 100 - 199 mg/dL   Triglycerides 886 0 - 149 mg/dL   HDL 64 >60 mg/dL   VLDL Cholesterol Cal 20 5 - 40 mg/dL   LDL Chol Calc (NIH) 85 0 - 99 mg/dL  PSA   Collection Time: 10/29/23  2:35 PM  Result Value Ref Range   Prostate Specific Ag, Serum 0.4 0.0 - 4.0 ng/mL  TSH   Collection Time: 10/29/23  2:35 PM  Result Value Ref Range   TSH 1.280 0.450 - 4.500 uIU/mL  Iron Binding Cap (TIBC)([facility])   Collection Time: 10/29/23  2:35 PM  Result Value Ref Range   Total Iron Binding Capacity 353 250 - 450 ug/dL   UIBC 734 888 - 656 ug/dL   Iron 88 38 - 830 ug/dL   Iron Saturation 25 15 - 55 %  Ferritin   Collection Time: 10/29/23  2:35 PM  Result Value Ref Range   Ferritin 74 30 - 400 ng/mL      Assessment & Plan:   Problem List Items Addressed This Visit   None Visit Diagnoses       Routine general medical examination at a health care facility    -  Primary   Vaccines up to date/given at the pharmacy. Screening labs checked today. Colonoscopy up to date. Continue diet and exercise. Call with any concerns.   Relevant Orders    Comprehensive metabolic panel with GFR   CBC with Differential/Platelet   Lipid Panel w/o Chol/HDL Ratio   PSA   TSH   Hepatitis B surface antibody,quantitative     Elevated blood pressure reading without diagnosis of hypertension       Will work on [employer] and check microalbumin. Call with any concerns. Continue to monitor.   Relevant Orders   Microalbumin, Urine Waived        LABORATORY TESTING:  Health maintenance labs ordered today as discussed above.   The natural history of prostate cancer and ongoing controversy regarding screening and potential treatment outcomes of prostate cancer has been discussed with the patient. The meaning of a false positive PSA and a false negative PSA has been discussed. He indicates understanding of the limitations of this screening test and wishes to proceed with screening PSA testing.   IMMUNIZATIONS:   - Tdap: Tetanus vaccination status reviewed: last tetanus booster within 10 years. - Influenza: Up to date - Prevnar: Given elsewhere - COVID: Given elsewhere - HPV: Not applicable - Shingrix vaccine: Up to date  SCREENING: - Colonoscopy: Up to date  Discussed with patient purpose of the colonoscopy is to detect colon cancer at curable precancerous or early stages   PATIENT COUNSELING:    Sexuality: Discussed sexually transmitted diseases, partner selection, use of condoms, avoidance of unintended pregnancy  and contraceptive alternatives.   Advised to avoid cigarette smoking.  I discussed with the patient that most people either abstain from alcohol or drink within safe limits (<=14/week and <=4 drinks/occasion for males, <=7/weeks and <= 3 drinks/occasion for females) and that the risk for alcohol disorders and other health effects rises proportionally with the number of drinks per week and how often a drinker exceeds daily limits.  Discussed cessation/primary prevention of drug use and availability of treatment for abuse.   Diet:  Encouraged to adjust caloric intake to maintain  or achieve ideal body weight, to reduce intake of dietary saturated fat and total fat, to limit sodium intake by avoiding high sodium foods and not adding table salt, and to maintain adequate dietary potassium and calcium preferably from fresh fruits, vegetables, and low-fat dairy products.    stressed the importance of regular exercise  Injury prevention: Discussed safety belts, safety helmets, smoke detector, smoking near bedding or upholstery.   Dental health: Discussed importance of regular tooth brushing, flossing, and dental visits.   Follow up plan: NEXT PREVENTATIVE PHYSICAL DUE IN 1 YEAR. Return in about 6 months (around 05/03/2025).

## 2024-11-04 LAB — CBC WITH DIFFERENTIAL/PLATELET
Basophils Absolute: 0.1 x10E3/uL (ref 0.0–0.2)
Basos: 1 %
EOS (ABSOLUTE): 0.6 x10E3/uL — ABNORMAL HIGH (ref 0.0–0.4)
Eos: 11 %
Hematocrit: 46.8 % (ref 37.5–51.0)
Hemoglobin: 15.5 g/dL (ref 13.0–17.7)
Immature Grans (Abs): 0 x10E3/uL (ref 0.0–0.1)
Immature Granulocytes: 0 %
Lymphocytes Absolute: 2.2 x10E3/uL (ref 0.7–3.1)
Lymphs: 42 %
MCH: 31.6 pg (ref 26.6–33.0)
MCHC: 33.1 g/dL (ref 31.5–35.7)
MCV: 96 fL (ref 79–97)
Monocytes Absolute: 0.4 x10E3/uL (ref 0.1–0.9)
Monocytes: 8 %
Neutrophils Absolute: 2 x10E3/uL (ref 1.4–7.0)
Neutrophils: 38 %
Platelets: 295 x10E3/uL (ref 150–450)
RBC: 4.9 x10E6/uL (ref 4.14–5.80)
RDW: 12.9 % (ref 11.6–15.4)
WBC: 5.2 x10E3/uL (ref 3.4–10.8)

## 2024-11-04 LAB — COMPREHENSIVE METABOLIC PANEL WITH GFR
ALT: 19 IU/L (ref 0–44)
AST: 23 IU/L (ref 0–40)
Albumin: 4.6 g/dL (ref 3.8–4.9)
Alkaline Phosphatase: 96 IU/L (ref 47–123)
BUN/Creatinine Ratio: 14 (ref 9–20)
BUN: 13 mg/dL (ref 6–24)
Bilirubin Total: 0.8 mg/dL (ref 0.0–1.2)
CO2: 27 mmol/L (ref 20–29)
Calcium: 9.4 mg/dL (ref 8.7–10.2)
Chloride: 100 mmol/L (ref 96–106)
Creatinine, Ser: 0.92 mg/dL (ref 0.76–1.27)
Globulin, Total: 2.7 g/dL (ref 1.5–4.5)
Glucose: 92 mg/dL (ref 70–99)
Potassium: 4.1 mmol/L (ref 3.5–5.2)
Sodium: 139 mmol/L (ref 134–144)
Total Protein: 7.3 g/dL (ref 6.0–8.5)
eGFR: 98 mL/min/1.73 (ref >59)

## 2024-11-04 LAB — TSH: TSH: 2.15 u[IU]/mL (ref 0.450–4.500)

## 2024-11-04 LAB — LIPID PANEL W/O CHOL/HDL RATIO
Cholesterol, Total: 186 mg/dL (ref 100–199)
HDL: 61 mg/dL (ref >39)
LDL Chol Calc (NIH): 109 mg/dL — ABNORMAL HIGH (ref 0–99)
Triglycerides: 86 mg/dL (ref 0–149)
VLDL Cholesterol Cal: 16 mg/dL (ref 5–40)

## 2024-11-04 LAB — PSA: Prostate Specific Ag, Serum: 0.6 ng/mL (ref 0.0–4.0)

## 2024-11-04 LAB — HEPATITIS B SURFACE ANTIBODY, QUANTITATIVE: Hepatitis B Surf Ab Quant: 41.6 m[IU]/mL

## 2024-11-08 ENCOUNTER — Ambulatory Visit: Payer: Self-pay | Admitting: Family Medicine

## 2025-05-04 ENCOUNTER — Ambulatory Visit: Admitting: Family Medicine
